# Patient Record
Sex: Female | Born: 1995 | Hispanic: Yes | Marital: Married | Smoking: Never smoker
Health system: Southern US, Community
[De-identification: ages and names within clinical notes are randomized; demographics above are authoritative.]

## PROBLEM LIST (undated history)

## (undated) DIAGNOSIS — E282 Polycystic ovarian syndrome: Secondary | ICD-10-CM

## (undated) DIAGNOSIS — L309 Dermatitis, unspecified: Secondary | ICD-10-CM

## (undated) DIAGNOSIS — D649 Anemia, unspecified: Secondary | ICD-10-CM

## (undated) DIAGNOSIS — G43909 Migraine, unspecified, not intractable, without status migrainosus: Secondary | ICD-10-CM

## (undated) DIAGNOSIS — F419 Anxiety disorder, unspecified: Secondary | ICD-10-CM

## (undated) HISTORY — PX: OTHER SURGICAL HISTORY: SHX169

## (undated) HISTORY — DX: Migraine, unspecified, not intractable, without status migrainosus: G43.909

## (undated) HISTORY — DX: Dermatitis, unspecified: L30.9

## (undated) HISTORY — PX: TONSILLECTOMY: SHX5217

## (undated) HISTORY — DX: Polycystic ovarian syndrome: E28.2

---
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
## 2020-03-11 ENCOUNTER — Other Ambulatory Visit: Payer: Self-pay

## 2020-03-11 ENCOUNTER — Encounter: Payer: Self-pay | Admitting: Family Medicine

## 2020-03-11 ENCOUNTER — Ambulatory Visit (INDEPENDENT_AMBULATORY_CARE_PROVIDER_SITE_OTHER): Payer: No Typology Code available for payment source | Admitting: Family Medicine

## 2020-03-11 VITALS — BP 118/68 | HR 103 | Temp 97.7°F | Ht 65.0 in | Wt 202.2 lb

## 2020-03-11 DIAGNOSIS — R000 Tachycardia, unspecified: Secondary | ICD-10-CM | POA: Insufficient documentation

## 2020-03-11 DIAGNOSIS — N644 Mastodynia: Secondary | ICD-10-CM | POA: Diagnosis not present

## 2020-03-11 DIAGNOSIS — R002 Palpitations: Secondary | ICD-10-CM

## 2020-03-11 DIAGNOSIS — G43109 Migraine with aura, not intractable, without status migrainosus: Secondary | ICD-10-CM | POA: Insufficient documentation

## 2020-03-11 DIAGNOSIS — E282 Polycystic ovarian syndrome: Secondary | ICD-10-CM | POA: Insufficient documentation

## 2020-03-11 NOTE — Assessment & Plan Note (Signed)
With painful breast lumps on exam. Anticipate fibrocystic breast changes, rec limiting caffeine. Pt states she had previous ultrasound/mammogram recommending biopsy >1 yr ago - will proceed with updated diagnostic mammo/US.

## 2020-03-11 NOTE — Progress Notes (Signed)
Patient ID: Alexandria Gomez, female    DOB: 1995-12-24, 25 y.o.   MRN: [id number]  This visit was conducted in person.  BP 118/68 (BP Location: Left Arm, Patient Position: Sitting, Cuff Size: Large)   Pulse (!) 103   Temp 97.7 F (36.5 C) (Temporal)   Ht 5\' 5"  (1.651 m)   Wt 202 lb 4 oz (91.7 kg)   LMP 02/20/2020   SpO2 99%   BMI 33.66 kg/m    CC: new pt to establish care  Subjective:   HPI: Alexandria Gomez is a 25 y.o. female presenting on 03/11/2020 for No chief complaint on file.   Notes fast heart rate since booster. Mild intermittent R sided chest pressure as well.   Seen at [facility] last year diagnosed with PCOS - started on medication but doesn't remember which - stopped due to hair loss and nausea (?spironolactone). She has not taken metformin. She received 03/13/2020 showing ovarian cysts and she notes hirsutism. Abdominal cramping with periods.   Migraines - overall stable period. Managed with excedrin migraine. Latest HA occurred December - usually present visual aura (spots in vision or or changing color to one side of vision) progress to nausea and headache. + photo/phonophobia, activity limiting. Doesn't remember prior meds tried. Drinks 1 cup caffeine daily.   Lump present to R lateral breast present for 7-8 yrs, not growing or changing. Tender to palpation, not itchy. More painful with period. No nipple discharge. Has had mammogram done in [facility], recommended biopsy but she never completed due to move.   S/p miscarriage age 67 yo.  LMP 02/20/2020    From 02/22/2020 Lives with husband [place] Occ: Murlean Hark at [employer] in [place] [employer]: 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 Activity: no regular exercise Diet: some water, fruits/vegetables daily     Relevant past medical, surgical, family and social history reviewed and updated as indicated. Interim medical history since our last visit reviewed. Allergies and medications reviewed and  updated. Outpatient Medications Prior to Visit  Medication Sig Dispense Refill  . Aspirin-Acetaminophen-Caffeine (EXCEDRIN MIGRAINE PO) Take by mouth as needed.    . cetirizine (ZYRTEC) 10 MG tablet Take 10 mg by mouth daily.    . rifampin (RIFADIN) 300 MG capsule Take 300 mg by mouth at bedtime.     No facility-administered medications prior to visit.     Per HPI unless specifically indicated in ROS section below Review of Systems Objective:  BP 118/68 (BP Location: Left Arm, Patient Position: Sitting, Cuff Size: Large)   Pulse (!) 103   Temp 97.7 F (36.5 C) (Temporal)   Ht 5\' 5"  (1.651 m)   Wt 202 lb 4 oz (91.7 kg)   LMP 02/20/2020   SpO2 99%   BMI 33.66 kg/m   Wt Readings from Last 3 Encounters:  03/11/20 202 lb 4 oz (91.7 kg)  01/23/12 125 lb 10.6 oz (57 kg) (70 %, Z= 0.52)*   * Growth percentiles are based on CDC (Girls, 2-20 Years) data.      Physical Exam Vitals and nursing note reviewed.  Constitutional:      Appearance: Normal appearance. She is not ill-appearing.  Eyes:     Extraocular Movements: Extraocular movements intact.     Pupils: Pupils are equal, round, and reactive to light.  Cardiovascular:     Rate and Rhythm: Normal rate and regular rhythm.     Pulses: Normal pulses.     Heart sounds: Normal heart sounds. No  murmur heard.   Pulmonary:     Effort: Pulmonary effort is normal. No respiratory distress.     Breath sounds: Normal breath sounds. No wheezing, rhonchi or rales.  Chest:  Breasts:     Right: Mass and tenderness present. No swelling, bleeding, inverted nipple, nipple discharge, skin change, axillary adenopathy or supraclavicular adenopathy.     Left: Mass present. No swelling, bleeding, inverted nipple, nipple discharge, skin change, tenderness, axillary adenopathy or supraclavicular adenopathy.      Comments:  Tender small breast lumps on R at 11 o clock Mildly tender small breast lump on L at 12 o clock Musculoskeletal:     Right  lower leg: No edema.     Left lower leg: No edema.  Lymphadenopathy:     Head:     Right side of head: No submental, submandibular or tonsillar adenopathy.     Left side of head: No submental, submandibular or tonsillar adenopathy.     Cervical: No cervical adenopathy.     Right cervical: No superficial cervical adenopathy.    Left cervical: No superficial cervical adenopathy.     Upper Body:     Right upper body: No supraclavicular or axillary adenopathy.     Left upper body: No supraclavicular or axillary adenopathy.  Skin:    General: Skin is warm and dry.     Findings: No rash.  Neurological:     Mental Status: She is alert.  Psychiatric:        Mood and Affect: Mood normal.        Behavior: Behavior normal.       EKG - NSR rate 90s, normal axis, intervals, no hypertrophy or acute ST/T changes  Assessment & Plan:  This visit occurred during the SARS-CoV-2 public health emergency.  Safety protocols were in place, including screening questions prior to the visit, additional usage of staff PPE, and extensive cleaning of exam room while observing appropriate contact time as indicated for disinfecting solutions.   Problem List Items Addressed This Visit    PCOS (polycystic ovarian syndrome)    Diagnosed in [place]. Consider trial metformin.       Migraine with aura    Very infrequent. Manages with PRN excedrin with good effect.       Relevant Medications   Aspirin-Acetaminophen-Caffeine (EXCEDRIN MIGRAINE PO)   Heart rate fast    EKG today - normal.  Discussed importance of good hydration status and limiting caffeine intake.       Breast pain - Primary    With painful breast lumps on exam. Anticipate fibrocystic breast changes, rec limiting caffeine. Pt states she had previous ultrasound/mammogram recommending biopsy >1 yr ago - will proceed with updated diagnostic mammo/US.       Relevant Orders   MM Digital Diagnostic Bilat   US BREAST LTD UNI RIGHT INC  AXILLA   US BREAST LTD UNI LEFT INC AXILLA    Other Visit Diagnoses    Palpitations       Relevant Orders   EKG 12-Lead (Completed)       No orders of the defined types were placed in this encounter.  Orders Placed This Encounter  Procedures  . MM Digital Diagnostic Bilat    Standing Status:   Future    Standing Expiration Date:   03/11/2021    Order Specific Question:   Reason for Exam (SYMPTOM  OR DIAGNOSIS REQUIRED)    Answer:   bilateral breast lumps with pain (R 11  oclock, L 12 oclock)    Order Specific Question:   Is the patient pregnant?    Answer:   No    Order Specific Question:   Preferred imaging location?    Answer:   [facility]  . US BREAST LTD UNI RIGHT INC AXILLA    Standing Status:   Future    Standing Expiration Date:   03/11/2021    Order Specific Question:   Reason for Exam (SYMPTOM  OR DIAGNOSIS REQUIRED)    Answer:   bilateral breast lumps with pain (R 11 oclock, L 12 oclock)    Order Specific Question:   Preferred imaging location?    Answer:   [facility]  . US BREAST LTD UNI LEFT INC AXILLA    Standing Status:   Future    Standing Expiration Date:   03/11/2021    Order Specific Question:   Reason for Exam (SYMPTOM  OR DIAGNOSIS REQUIRED)    Answer:   bilateral breast lumps with pain (R 11 oclock, L 12 oclock)    Order Specific Question:   Preferred imaging location?    Answer:   [facility]  . EKG 12-Lead    Patient instructions: EKG today.  I anticipate you have fibrocystic breast changes - see below. We will refer you for mammogram and ultrasound at the [facility] in [place]. Try to do breast exams monthly at the same time each month.  Increase water intake.  Return as needed or in 6 months for physical   Follow up plan: No follow-ups on file.  Eustaquio Boyden, MD

## 2020-03-11 NOTE — Assessment & Plan Note (Signed)
EKG today - normal.  Discussed importance of good hydration status and limiting caffeine intake.

## 2020-03-11 NOTE — Assessment & Plan Note (Signed)
Very infrequent. Manages with PRN excedrin with good effect.

## 2020-03-11 NOTE — Patient Instructions (Addendum)
EKG today.  I anticipate you have fibrocystic breast changes - see below. We will refer you for mammogram and ultrasound at the [facility] in [place]. Try to do breast exams monthly at the same time each month.  Increase water intake.  Return as needed or in 6 months for physical   Fibrocystic Breast Changes  Fibrocystic breast changes are changes in breast tissue that can cause breasts to become swollen, lumpy, or painful. This can happen due to buildup of scar-like tissue (fibrous tissue) or the forming of fluid-filled lumps (cysts) in the breast. Fibrocystic breast changes can affect one or both breasts. The condition is common, and it is not cancer. What are the causes? The exact cause of fibrocystic breast changes is not known. However, this condition may be:  Related to the female hormones estrogen and progesterone.  Influenced by family traits that get passed from parent to child (inherited). What are the signs or symptoms? Symptoms of this condition include:  Tenderness, swelling, mild discomfort, or pain.  Rope-like tissue that can be felt when touching the breast.  Lumps in one or both breasts.  Changes in breast size. Breasts may get larger before a menstrual period and smaller after a menstrual period.  Discharge from the nipple. Symptoms of this condition may affect one or both breasts and are usually worse before menstrual periods start. Symptoms usually get better toward the end of menstrual periods. How is this diagnosed? This condition is diagnosed based on your medical history and a physical exam of your breasts. You may also have tests, such as:  A breast X-ray (mammogram).  Ultrasound.  MRI.  Removing a small sample of tissue from the breast for tests (breast biopsy). This may be done if your health care provider thinks that something else may be causing changes in your breasts. How is this treated? Often, treatment is not needed for this condition. In  some cases, however, treatment may be needed, including:  Taking over-the-counter pain medicines to help relieve pain or discomfort.  Limiting or avoiding caffeine. Foods and beverages that contain caffeine include chocolate, soda, coffee, and tea.  Reducing sugar and fat in your diet. Treatment may also include:  A procedure to remove fluid from a cyst that is causing pain (fine needle aspiration).  Surgery to remove a cyst that is large or tender or does not go away.  Medicines that may lower the amount of female hormones. Follow these instructions at home: Self care  Check your breasts after every menstrual period. If you do not have menstrual periods, check your breasts on the first day of every month. Feel for changes in your breasts, such as: ? More tenderness. ? A new growth. ? A change in size. ? A change in an existing lump. General instructions  Take over-the-counter and prescription medicines only as told by your health care provider.  Wear a well-fitting support or sports bra, especially when exercising.  If told by your health care provider, decrease or avoid caffeine, fat, and sugar in your diet.  Keep all follow-up visits as told by your health care provider. This is important. Contact a health care provider if:  You have fluid leaking from your nipple, especially if it is bloody.  You have new lumps or bumps in your breast.  Your breast becomes enlarged, red, and painful.  You have areas of your breast that pucker inward.  Your nipple appears flat or indented. Get help right away if:  You have  redness of your breast and the redness is spreading. Summary  Fibrocystic breast changes are changes in breast tissue that can cause breasts to become swollen, lumpy, or painful.  This condition may be related to the female hormones estrogen and progesterone.  With this condition, it is important to examine your breasts after every menstrual period. If you do  not have menstrual periods, check your breasts on the first day of every month. This information is not intended to replace advice given to you by your health care provider. Make sure you discuss any questions you have with your health care provider. Document Revised: 01/26/2019 Document Reviewed: 01/26/2019 Elsevier Patient Education  2021 [employer].

## 2020-03-11 NOTE — Assessment & Plan Note (Signed)
Diagnosed in [place]. Consider trial metformin.

## 2020-04-01 ENCOUNTER — Telehealth: Payer: Self-pay

## 2020-04-01 NOTE — Telephone Encounter (Signed)
Per appt desk notes [facility] called and l/m for patient on 03/23/20 to call back and schedule mammogram and US of the breast as ordered per Dr Reece Agar. Ashtyn called patient on 03/17/20 and left message. I called again today and left message for patient to call them back to schedule.

## 2020-07-15 ENCOUNTER — Other Ambulatory Visit: Payer: Self-pay

## 2020-07-15 ENCOUNTER — Inpatient Hospital Stay (HOSPITAL_COMMUNITY)
Admission: EM | Admit: 2020-07-15 | Discharge: 2020-07-16 | Disposition: A | Discharge status: Home / Self Care | Payer: Medicaid Other | Attending: Obstetrics & Gynecology | Admitting: Obstetrics & Gynecology

## 2020-07-15 ENCOUNTER — Inpatient Hospital Stay (HOSPITAL_COMMUNITY): Payer: Medicaid Other

## 2020-07-15 ENCOUNTER — Encounter (HOSPITAL_COMMUNITY): Payer: Self-pay

## 2020-07-15 DIAGNOSIS — Z3A01 Less than 8 weeks gestation of pregnancy: Secondary | ICD-10-CM | POA: Insufficient documentation

## 2020-07-15 DIAGNOSIS — O219 Vomiting of pregnancy, unspecified: Secondary | ICD-10-CM | POA: Diagnosis not present

## 2020-07-15 DIAGNOSIS — O26891 Other specified pregnancy related conditions, first trimester: Secondary | ICD-10-CM | POA: Insufficient documentation

## 2020-07-15 DIAGNOSIS — O209 Hemorrhage in early pregnancy, unspecified: Secondary | ICD-10-CM | POA: Diagnosis not present

## 2020-07-15 DIAGNOSIS — R1030 Lower abdominal pain, unspecified: Secondary | ICD-10-CM | POA: Diagnosis not present

## 2020-07-15 DIAGNOSIS — O2621 Pregnancy care for patient with recurrent pregnancy loss, first trimester: Secondary | ICD-10-CM | POA: Insufficient documentation

## 2020-07-15 DIAGNOSIS — O200 Threatened abortion: Secondary | ICD-10-CM

## 2020-07-15 DIAGNOSIS — O4690 Antepartum hemorrhage, unspecified, unspecified trimester: Secondary | ICD-10-CM

## 2020-07-15 LAB — WET PREP, GENITAL
Sperm: NONE SEEN
Trich, Wet Prep: NONE SEEN
WBC, Wet Prep HPF POC: NONE SEEN
Yeast Wet Prep HPF POC: NONE SEEN

## 2020-07-15 LAB — POC URINE PREG, ED: Preg Test, Ur: POSITIVE — AB

## 2020-07-15 MED ORDER — ACETAMINOPHEN 500 MG PO TABS
1000.0000 mg | ORAL_TABLET | Freq: Once | ORAL | Status: AC
  Administered 2020-07-15: 1000 mg via ORAL
  Filled 2020-07-15: qty 2

## 2020-07-15 MED ORDER — METOCLOPRAMIDE HCL 10 MG PO TABS
10.0000 mg | ORAL_TABLET | Freq: Once | ORAL | Status: AC
  Administered 2020-07-15: 10 mg via ORAL
  Filled 2020-07-15: qty 1

## 2020-07-15 NOTE — MAU Provider Note (Signed)
History     CSN: [id number]  Arrival date and time: 07/15/20 2036   Event Date/Time   First Provider Initiated Contact with Patient 07/15/20 2303      Chief Complaint  Patient presents with  . Vaginal Bleeding   Alexandria Gomez is a 25 y.o. G2P0010 at [redacted]w[redacted]d by Definite LMP of May 26, 2020 who plans to receive care at [facility].  She presents today for Vaginal Bleeding.  She states she noticed spotting yesterday and is concerned because she had a miscarriage in the past and "this is how it started." She states it was light pink in color, yesterday, and today it is brown.  She reports usage of a pantyliner, but with minimal spotting noted.  She endorses cramping and has not taken anything for it.  She reports it is located in her lower abdomen. She rates the pain a 5-6/10. She denies sexual activity in the past 72 hours.     OB History    Gravida  2   Para      Term      Preterm      AB  1   Living        SAB  1   IAB      Ectopic      Multiple      Live Births              Past Medical History:  Diagnosis Date  . Migraines   . PCOS (polycystic ovarian syndrome)     Past Surgical History:  Procedure Laterality Date  . TONSILLECTOMY      Family History  Problem Relation Age of Onset  . Arthritis Mother   . Cancer Mother        kidney   . Diabetes Mother   . Learning disabilities Mother   . Miscarriages / [place] Mother   . COPD Father   . Heart attack Father 35  . Alcohol abuse Sister   . Depression Sister   . Kidney disease Sister   . Diabetes Maternal Grandmother   . High Cholesterol Maternal Grandmother   . Miscarriages / Stillbirths Maternal Grandmother   . Diabetes Maternal Grandfather   . High blood pressure Maternal Grandfather   . Breast cancer Maternal Aunt     Social History   Tobacco Use  . Smoking status: Never Smoker  . Smokeless tobacco: Never Used  Substance Use Topics  . Alcohol use: Yes    Comment:  Occasionally  . Drug use: Never    Allergies: No Known Allergies  Medications Prior to Admission  Medication Sig Dispense Refill Last Dose  . Aspirin-Acetaminophen-Caffeine (EXCEDRIN MIGRAINE PO) Take by mouth as needed.     . cetirizine (ZYRTEC) 10 MG tablet Take 10 mg by mouth daily.       Review of Systems  Gastrointestinal: Positive for abdominal pain and nausea. Negative for constipation, diarrhea and vomiting.  Genitourinary: Positive for vaginal bleeding. Negative for difficulty urinating, dysuria and vaginal discharge.  Neurological: Negative for dizziness, light-headedness and headaches.   Physical Exam   Blood pressure 116/71, pulse 87, temperature 97.9 F (36.6 C), resp. rate 16, height 5\' 5"  (1.651 m), weight 83.9 kg, last menstrual period 05/26/2020, SpO2 100 %.  Physical Exam Vitals reviewed. Exam conducted with a chaperone present.  Constitutional:      Appearance: Normal appearance.  HENT:     Head: Normocephalic and atraumatic.  Eyes:     Conjunctiva/sclera: Conjunctivae normal.  Cardiovascular:     Rate and Rhythm: Normal rate and regular rhythm.  Pulmonary:     Effort: Pulmonary effort is normal. No respiratory distress.  Abdominal:     Palpations: Abdomen is soft.     Tenderness: There is abdominal tenderness in the right lower quadrant, suprapubic area and left lower quadrant.  Genitourinary:    Vagina: Vaginal discharge present.     Cervix: Discharge present. No friability, erythema or cervical bleeding.     Comments: Speculum Exam: -Normal External Genitalia: Non tender, no apparent discharge at introitus.  -Vaginal Vault: Pink mucosa with good rugae. Scant amt thin white discharge -wet prep collected -Cervix:Pink, no lesions, cysts, or polyps.  Appears closed. No active bleeding from os-GC/CT collected with dark brown blood noted on swab.  -Bimanual Exam:  No uterine tenderness.  Patient reports discomfort, but can not differentiate whether it is  pressure or pain.   Musculoskeletal:     Cervical back: Normal range of motion.  Skin:    General: Skin is warm and dry.  Neurological:     Mental Status: She is alert and oriented to person, place, and time.  Psychiatric:        Mood and Affect: Mood normal.        Behavior: Behavior normal.        Thought Content: Thought content normal.     MAU Course  Procedures Results for orders placed or performed during the hospital encounter of 07/15/20 (from the past 24 hour(s))  POC urine preg, ED     Status: Abnormal   Collection Time: 07/15/20  9:52 PM  Result Value Ref Range   Preg Test, Ur POSITIVE (A) NEGATIVE  ABO/Rh     Status: None   Collection Time: 07/15/20 11:06 PM  Result Value Ref Range   ABO/RH(D) A POS    No rh immune globuloin      NOT A RH IMMUNE GLOBULIN CANDIDATE, PT RH POSITIVE Performed at [facility], [address] [place], [place] [address]   CBC     Status: None   Collection Time: 07/15/20 11:06 PM  Result Value Ref Range   WBC 9.6 4.0 - 10.5 K/uL   RBC 4.13 3.87 - 5.11 MIL/uL   Hemoglobin 12.5 12.0 - 15.0 g/dL   HCT 81.4 48.1 - 85.6 %   MCV 90.1 80.0 - 100.0 fL   MCH 30.3 26.0 - 34.0 pg   MCHC 33.6 30.0 - 36.0 g/dL   RDW 31.4 97.0 - 26.3 %   Platelets 273 150 - 400 K/uL   nRBC 0.0 0.0 - 0.2 %  Urinalysis, Routine w reflex microscopic Urine, Clean Catch     Status: None   Collection Time: 07/15/20 11:14 PM  Result Value Ref Range   Color, Urine YELLOW YELLOW   APPearance CLEAR CLEAR   Specific Gravity, Urine 1.023 1.005 - 1.030   pH 5.0 5.0 - 8.0   Glucose, UA NEGATIVE NEGATIVE mg/dL   Hgb urine dipstick NEGATIVE NEGATIVE   Bilirubin Urine NEGATIVE NEGATIVE   Ketones, ur NEGATIVE NEGATIVE mg/dL   Protein, ur NEGATIVE NEGATIVE mg/dL   Nitrite NEGATIVE NEGATIVE   Leukocytes,Ua NEGATIVE NEGATIVE   RBC / HPF 0-5 0 - 5 RBC/hpf   WBC, UA 0-5 0 - 5 WBC/hpf   Bacteria, UA NONE SEEN NONE SEEN   Squamous Epithelial / LPF 0-5 0 - 5   Mucus  PRESENT   Wet prep, genital  Status: Abnormal   Collection Time: 07/15/20 11:14 PM  Result Value Ref Range   Yeast Wet Prep HPF POC NONE SEEN NONE SEEN   Trich, Wet Prep NONE SEEN NONE SEEN   Clue Cells Wet Prep HPF POC PRESENT (A) NONE SEEN   WBC, Wet Prep HPF POC NONE SEEN NONE SEEN   Sperm NONE SEEN    US OB LESS THAN 14 WEEKS WITH OB TRANSVAGINAL  Result Date: 07/15/2020 CLINICAL DATA:  Spotting and cramps. EXAM: OBSTETRIC <14 WK [place] AND TRANSVAGINAL OB US TECHNIQUE: Both transabdominal and transvaginal ultrasound examinations were performed for complete evaluation of the gestation as well as the maternal uterus, adnexal regions, and pelvic cul-de-sac. Transvaginal technique was performed to assess early pregnancy. COMPARISON:  None. FINDINGS: Intrauterine gestational sac: Single Yolk sac:  Visualized. Embryo:  Visualized. Cardiac Activity: Not Visualized. Heart Rate: N/A  bpm CRL:  2.7 mm   5 w   5 d                  [place] EDC: March 12, 2021 Subchorionic hemorrhage:  None visualized. Maternal uterus/adnexae: The right ovary is not visualized. A corpus luteum cyst is seen within an otherwise normal appearing left ovary. No pelvic free fluid is seen. IMPRESSION: Single intrauterine pregnancy, at approximately 5 weeks and 5 days gestation by ultrasound evaluation, without evidence of fetal heart motion. Findings are suspicious but not yet definitive for failed pregnancy. Recommend follow-up US in 10-14 days for definitive diagnosis. This recommendation follows SRU consensus guidelines: Diagnostic Criteria for Nonviable Pregnancy Early in the First Trimester. Malva Limes Med 2013; 259:5638-75. Electronically Signed   By: Aram Candela M.D.   On: 07/15/2020 23:39    MDM Pelvic Exam; Wet Prep and GC/CT Labs: UA, UPT, CBC, hCG, ABO Ultrasound Analgesic Antiemetic Assessment and Plan  25 year old G2P0010 at 7.1 weeks Vaginal Bleeding Abdominal Cramping Nausea  -POC Reviewed. -Discussed  labs and pelvic exam including testing. -Exam performed and findings reviewed.  -Will send for [place] -Will give reglan for nausea and tylenol for pain. -Will await results.   Cherre Robins 07/15/2020, 11:03 PM   Reassessment (12:22 AM)  -[place] and lab results return as above. HCG pending. -Provider to bedside to discus. -Patient reports pain has improved.  -Informed of findings and change in EDD. -Reassured that dating could be wrong and that she is actually only 5 weeks and not 7.  Discussed need for follow up. -Cautioned that miscarriage is still of concern.  -Reviewed need for follow up US in 10-14 days.  -Patient agreeable and states that she will follow up with her primary ob on June 1st as scheduled.  -Informed of clue cells discovered on wet prep and recommendaiton for treatment based on c/o cramping and spotting. -Rx. for Metrogel 0.75% PV QHS x 5days sent to pharmacy on file.  -Patient without further questions or concerns. -Encouraged to call or return to MAU if symptoms worsen or with the onset of new symptoms. -Discharged to home in stble condition.  Cherre Robins MSN, CNM Advanced Practice Provider, [facility]

## 2020-07-15 NOTE — ED Provider Notes (Signed)
Emergency Medicine Provider Triage Evaluation Note  Alexandria Gomez , a 25 y.o. female  was evaluated in triage.  Pt complains of vaginal bleeding in early pregnancy.  LMP 05/26/2020, started spotting last night with lower abdominal cramping.  G2, P0, is not scheduled to see her OB for the first time until June..  Review of Systems  Positive: Vaginal bleeding, cramping Negative: Fever  Physical Exam  There were no vitals taken for this visit. Gen:   Awake, no distress   Resp:  Normal effort  MSK:   Moves extremities without difficulty  Other:  Abdomen soft and nontender  Medical Decision Making  Medically screening exam initiated at 8:48 PM.  Appropriate orders placed.  Alexandria Gomez was informed that the remainder of the evaluation will be completed by another provider, this initial triage assessment does not replace that evaluation, and the importance of remaining in the ED until their evaluation is complete.  Call to [facility], MAU APP who accepts pt in transfer to MAU.    Jeannie Fend, PA-C 07/15/20 2200    Benjiman Core, MD 07/15/20 2308

## 2020-07-15 NOTE — MAU Note (Signed)
I had pink spotting yesterday and today brown d/c. I have had a miscarriage and wanted to be sure everything is ok. Some period-like cramping.

## 2020-07-15 NOTE — ED Triage Notes (Signed)
Patient reports vaginal bleeding and cramping starting yesterday.   LMP 05/26/2020

## 2020-07-16 DIAGNOSIS — O209 Hemorrhage in early pregnancy, unspecified: Secondary | ICD-10-CM

## 2020-07-16 DIAGNOSIS — Z3A01 Less than 8 weeks gestation of pregnancy: Secondary | ICD-10-CM | POA: Diagnosis not present

## 2020-07-16 DIAGNOSIS — O219 Vomiting of pregnancy, unspecified: Secondary | ICD-10-CM | POA: Diagnosis not present

## 2020-07-16 LAB — URINALYSIS, ROUTINE W REFLEX MICROSCOPIC
Bacteria, UA: NONE SEEN
Bilirubin Urine: NEGATIVE
Glucose, UA: NEGATIVE mg/dL
Hgb urine dipstick: NEGATIVE
Ketones, ur: NEGATIVE mg/dL
Leukocytes,Ua: NEGATIVE
Nitrite: NEGATIVE
Protein, ur: NEGATIVE mg/dL
Specific Gravity, Urine: 1.023 (ref 1.005–1.030)
pH: 5 (ref 5.0–8.0)

## 2020-07-16 LAB — CBC
HCT: 37.2 % (ref 36.0–46.0)
Hemoglobin: 12.5 g/dL (ref 12.0–15.0)
MCH: 30.3 pg (ref 26.0–34.0)
MCHC: 33.6 g/dL (ref 30.0–36.0)
MCV: 90.1 fL (ref 80.0–100.0)
Platelets: 273 10*3/uL (ref 150–400)
RBC: 4.13 MIL/uL (ref 3.87–5.11)
RDW: 12.2 % (ref 11.5–15.5)
WBC: 9.6 10*3/uL (ref 4.0–10.5)
nRBC: 0 % (ref 0.0–0.2)

## 2020-07-16 LAB — ABO/RH: ABO/RH(D): A POS

## 2020-07-16 LAB — HCG, QUANTITATIVE, PREGNANCY: hCG, Beta Chain, Quant, S: 24810 m[IU]/mL — ABNORMAL HIGH (ref <5)

## 2020-07-16 MED ORDER — METRONIDAZOLE 0.75 % VA GEL: 1.0000 | Freq: Every day | VAGINAL | 0 refills | Status: DC

## 2020-07-16 NOTE — Discharge Instructions (Signed)
Bacterial Vaginosis  Bacterial vaginosis is an infection that occurs when the normal balance of bacteria in the vagina changes. This change is caused by an overgrowth of certain bacteria in the vagina. Bacterial vaginosis is the most common vaginal infection among females aged 23 to 44 years. This condition increases the risk of sexually transmitted infections (STIs). Treatment can help reduce this risk. Treatment is very important for pregnant women because this condition can cause babies to be born early (prematurely) or at a low birth weight. What are the causes? This condition is caused by an increase in harmful bacteria that are normally present in small amounts in the vagina. However, the exact reason this condition develops is not known. You cannot get bacterial vaginosis from toilet seats, bedding, swimming pools, or contact with objects around you. What increases the risk? The following factors may make you more likely to develop this condition:  Having a new sexual partner or multiple sexual partners, or having unprotected sex.  Douching.  Having an intrauterine device (IUD).  Smoking.  Abusing drugs and alcohol. This may lead to riskier sexual behavior.  Taking certain antibiotic medicines.  Being pregnant. What are the signs or symptoms? Some women with this condition have no symptoms. Symptoms may include:  Gray or white vaginal discharge. The discharge can be watery or foamy.  A fish-like odor with discharge, especially after sex or during menstruation.  Itching in and around the vagina.  Burning or pain with urination. How is this diagnosed? This condition is diagnosed based on:  Your medical history.  A physical exam of the vagina.  Checking a sample of vaginal fluid for harmful bacteria or abnormal cells. How is this treated? This condition is treated with antibiotic medicines. These may be given as a pill, a vaginal cream, or a medicine that is put into the  vagina (suppository). If the condition comes back after treatment, a second round of antibiotics may be needed. Follow these instructions at home: Medicines  Take or apply over-the-counter and prescription medicines only as told by your health care provider.  Take or apply your antibiotic medicine as told by your health care provider. Do not stop using the antibiotic even if you start to feel better. General instructions  If you have a female sexual partner, tell her that you have a vaginal infection. She should follow up with her health care provider. If you have a female sexual partner, he does not need treatment.  Avoid sexual activity until you finish treatment.  Drink enough fluid to keep your urine pale yellow.  Keep the area around your vagina and rectum clean. ? Wash the area daily with warm water. ? Wipe yourself from front to back after using the toilet.  If you are breastfeeding, talk to your health care provider about continuing breastfeeding during treatment.  Keep all follow-up visits. This is important. How is this prevented? Self-care  Do not douche.  Wash the outside of your vagina with warm water only.  Wear cotton or cotton-lined underwear.  Avoid wearing tight pants and pantyhose, especially during the summer. Safe sex  Use protection when having sex. This includes: ? Using condoms. ? Using dental dams. This is a thin layer of a material made of latex or polyurethane that protects the mouth during oral sex.  Limit the number of sexual partners. To help prevent bacterial vaginosis, it is best to have sex with just one partner (monogamous relationship).  Make sure you and your sexual partner   are tested for STIs. Drugs and alcohol  Do not use any products that contain nicotine or tobacco. These products include cigarettes, chewing tobacco, and vaping devices, such as e-cigarettes. If you need help quitting, ask your health care provider.  Do not use  drugs.  Do not drink alcohol if: ? Your health care provider tells you not to do this. ? You are pregnant, may be pregnant, or are planning to become pregnant.  If you drink alcohol: ? Limit how much you have to 0-1 drink a day. ? Be aware of how much alcohol is in your drink. In the [place] one drink equals one 12 oz bottle of beer (355 mL), one 5 oz glass of wine (148 mL), or one 1 oz glass of hard liquor (44 mL). Where to find more information  Centers for Disease Control and Prevention: FootballExhibition.com.br  American Sexual Health Association (ASHA): www.ashastd.org  U.S. Department of Health and Health and safety inspector, Office on Women's Health: http://hoffman.com/ Contact a health care provider if:  Your symptoms do not improve, even after treatment.  You have more discharge or pain when urinating.  You have a fever or chills.  You have pain in your abdomen or pelvis.  You have pain during sex.  You have vaginal bleeding between menstrual periods. Summary  Bacterial vaginosis is a vaginal infection that occurs when the normal balance of bacteria in the vagina changes. It results from an overgrowth of certain bacteria.  This condition increases the risk of sexually transmitted infections (STIs). Getting treated can help reduce this risk.  Treatment is very important for pregnant women because this condition can cause babies to be born early (prematurely) or at low birth weight.  This condition is treated with antibiotic medicines. These may be given as a pill, a vaginal cream, or a medicine that is put into the vagina (suppository). This information is not intended to replace advice given to you by your health care provider. Make sure you discuss any questions you have with your health care provider. Document Revised: 08/13/2019 Document Reviewed: 08/13/2019 Elsevier Patient Education  2021 Elsevier Inc. Threatened Miscarriage A threatened miscarriage occurs when a woman has vaginal  bleeding during the first 20 weeks of pregnancy but the pregnancy has not ended. If vaginal bleeding occurs during this time, the health care provider will do tests to make sure the woman is still pregnant. The woman's condition may be considered a threatened miscarriage if the tests show:  That she is still pregnant.  That the embryo or unborn baby (fetus) inside the uterus is still growing. A threatened miscarriage does not mean your pregnancy will end, but it does increase the risk of losing your pregnancy (miscarriage). What are the causes? The cause of this condition is usually not known. What increases the risk? The following factors may make a pregnant woman more likely to have a miscarriage: Certain medical conditions  Conditions that affect the hormone balance in the body, such as thyroid disease or polycystic ovary syndrome.  Diabetes.  Autoimmune disorders.  Infections.  Bleeding disorders.  Obesity. Lifestyle factors  Using products with tobacco or nicotine or being exposed to tobacco smoke.  Having alcohol.  Having large amounts of caffeine.  Recreational drug use. Problems with reproductive organs or structures  Cervical insufficiency. This is when the the lowest part of the uterus (cervix) opens and thins before pregnancy is at term.  Having a condition called Asherman syndrome, which causes scarring in the uterus or causes the  uterus to be abnormal in structure.  Fibrous growths, called fibroids, in the uterus.  Congenital abnormalities. These problems are present at birth.  Infection of the cervix or uterus. Personal or medical history  Injury (trauma).  Having had a miscarriage before.  Being younger than age 52 or older than age 64.  Exposure to harmful substances in the environment. This may include radiation or heavy metals, such as lead.  Using certain medicines. What are the signs or symptoms? Symptoms of this condition include:  Vaginal  bleeding or spotting, with or without cramps or pain.  Mild pain or cramps in your abdomen. How is this diagnosed? You may have tests to check whether you are still pregnant. These tests will be done if you have bleeding, with or without pain, in your abdomen before the 20th week of pregnancy. These tests include:  Ultrasound.  A physical exam.  Measurement of your baby's heart rate.  Lab tests, such as blood tests, urine tests, or swabs for infection. You may be diagnosed with a threatened miscarriage if:  Ultrasound testing shows that you are still pregnant.  Your baby's heart rate is strong.  A physical exam shows that your cervix is closed.  Blood tests confirm that you are still pregnant.   How is this treated? No treatments have been shown to prevent a threatened miscarriage from going on to a complete miscarriage. However, the right home care is important. Follow these instructions at home:  Get plenty of rest.  Do not have sex, douche, or put anything in your vagina, such as tampons, until your health care provider says it is okay.  Do not smoke or use recreational drugs.  Do not drink alcohol.  Avoid caffeine.  Keep all follow-up prenatal visits. This is important. Contact a health care provider if:  You have light vaginal bleeding or spotting while pregnant.  You have pain or cramping in your abdomen.  You have a fever. Get help right away if:  Heavy bleeding soaks through 2 large sanitary pads an hour for more than 2 hours.  Blood clots come out of your vagina.  Tissue comes out of your vagina.  You leak fluid, or you have a gush of fluid from your vagina.  You have severe low back pain or cramps in your abdomen.  You have a fever, chills, and severe pain in the abdomen. Summary  A threatened miscarriage occurs when a woman bleeds from the vagina during the first 20 weeks of pregnancy but the pregnancy has not ended.  The cause of a threatened  miscarriage is usually not known.  Symptoms of this condition may include vaginal bleeding and mild pain or cramps in your abdomen.  No treatments have been shown to prevent a threatened miscarriage from going on to a complete miscarriage.  Keep all follow-up prenatal visits. This is important. This information is not intended to replace advice given to you by your health care provider. Make sure you discuss any questions you have with your health care provider. Document Revised: 08/14/2019 Document Reviewed: 08/14/2019 Elsevier Patient Education  2021 [employer].

## 2020-07-16 NOTE — Progress Notes (Signed)
Jessica Emly CNM in earlier to discuss test results and d/c plan. Written and verbal d/c instructions given and understanding voiced. 

## 2020-07-18 LAB — GC/CHLAMYDIA PROBE AMP (~~LOC~~) NOT AT ARMC
Chlamydia: NEGATIVE
Comment: NEGATIVE
Comment: NORMAL
Neisseria Gonorrhea: NEGATIVE

## 2020-07-19 ENCOUNTER — Telehealth: Payer: Self-pay | Admitting: Certified Nurse Midwife

## 2020-07-19 NOTE — Telephone Encounter (Signed)
5-24- patient called stating that she went to er in [place] for spotting/cramping - [facility] - they suggested pt get [place] within 10 days - made pt aware no [place] tech in office. She is a New Gyn patient scheduled for June1st. Patient is asking for a rx. for nausea- I made her aware that the provider can not order her anything since she has not been seen in our office before. Please Advise.

## 2020-07-20 ENCOUNTER — Other Ambulatory Visit: Payer: Self-pay | Admitting: Certified Nurse Midwife

## 2020-07-20 DIAGNOSIS — O3680X0 Pregnancy with inconclusive fetal viability, not applicable or unspecified: Secondary | ICD-10-CM

## 2020-07-20 NOTE — Telephone Encounter (Signed)
Called patient back to see if she is pregnant. No answer, lvm to rtc.

## 2020-07-27 ENCOUNTER — Ambulatory Visit (INDEPENDENT_AMBULATORY_CARE_PROVIDER_SITE_OTHER): Payer: Medicaid Other | Admitting: Certified Nurse Midwife

## 2020-07-27 ENCOUNTER — Other Ambulatory Visit: Payer: Self-pay

## 2020-07-27 ENCOUNTER — Encounter: Payer: Self-pay | Admitting: Certified Nurse Midwife

## 2020-07-27 VITALS — BP 102/69 | HR 86 | Resp 16 | Ht 65.0 in | Wt 186.9 lb

## 2020-07-27 DIAGNOSIS — Z3200 Encounter for pregnancy test, result unknown: Secondary | ICD-10-CM

## 2020-07-27 MED ORDER — DOXYLAMINE-PYRIDOXINE 10-10 MG PO TBEC: 1.0000 | DELAYED_RELEASE_TABLET | Freq: Four times a day (QID) | ORAL | 5 refills | Status: DC

## 2020-07-27 NOTE — Progress Notes (Signed)
Subjective:    Alexandria Gomez is a 25 y.o. female who presents for evaluation of amenorrhea. She believes she could be pregnant. Pregnancy is desired. Sexual Activity: single partner, contraception: none. Current symptoms also include: breast tenderness, nausea and vomiting. Last period was normal. Pt was seen in ED 5/20 with vaginal bleeding and cramping. U/s completed results show: Single intrauterine pregnancy, at approximately 5 weeks and 5 days gestation by ultrasound evaluation, without evidence of fetal heart motion. Findings are suspicious but not yet definitive for failed pregnancy. Recommend follow-up US in 10-14 days for definitive diagnosis.    Repeat u/s for viability scheduled 08/02/2020.  Patient's last menstrual period was 05/26/2020 (exact date). The following portions of the patient's history were reviewed and updated as appropriate: allergies, current medications, past family history, past medical history, past social history, past surgical history and problem list.  Review of Systems Pertinent items are noted in HPI.     Objective:    BP 102/69   Pulse 86   Resp 16   Ht 5\' 5"  (1.651 m)   Wt 186 lb 14.4 oz (84.8 kg)   LMP 05/26/2020 (Exact Date)   BMI 31.10 kg/m  General: alert, cooperative, appears stated age, mild distress and no acute distress    Lab Review Urine HCG: positive    Assessment:    Absence of menstruation.     Plan:     Positive: EDC: 03/12/2021. Briefly discussed pre-natal care options. Md and midwifery services reviewed. Pt plans to see midwives. Encouraged well-balanced diet, plenty of rest when needed, pre-natal vitamins daily and walking for exercise. Discussed self-help for nausea, avoiding OTC medications until consulting provider or pharmacist, other than Tylenol as needed, minimal caffeine (1-2 cups daily) and avoiding alcohol. She will schedule her initial nurse visit in 3 wks, and NOB visit in the 5 wks. Orders placed for diclegis  . 03/14/2021 Feel free to call with any questions.   Marland Kitchen, CNM

## 2020-08-02 ENCOUNTER — Telehealth: Payer: Self-pay | Admitting: Certified Nurse Midwife

## 2020-08-02 ENCOUNTER — Ambulatory Visit
Admission: RE | Admit: 2020-08-02 | Discharge: 2020-08-02 | Disposition: A | Discharge status: Home / Self Care | Payer: Medicaid Other | Source: Ambulatory Visit | Attending: Certified Nurse Midwife | Admitting: Certified Nurse Midwife

## 2020-08-02 ENCOUNTER — Other Ambulatory Visit: Payer: Self-pay

## 2020-08-02 DIAGNOSIS — O3680X0 Pregnancy with inconclusive fetal viability, not applicable or unspecified: Secondary | ICD-10-CM | POA: Insufficient documentation

## 2020-08-02 NOTE — Telephone Encounter (Signed)
Patient states she wasn't prescribed any prenatal vitamins and is wondering if that was suppose to be done or if that will be done at her next visit

## 2020-08-04 ENCOUNTER — Other Ambulatory Visit: Payer: Self-pay | Admitting: Certified Nurse Midwife

## 2020-08-04 DIAGNOSIS — O3680X0 Pregnancy with inconclusive fetal viability, not applicable or unspecified: Secondary | ICD-10-CM

## 2020-08-04 MED ORDER — COMPLETENATE 29-1 MG PO CHEW: 1.0000 | CHEWABLE_TABLET | Freq: Every day | ORAL | 11 refills | Status: DC

## 2020-08-04 NOTE — Telephone Encounter (Signed)
Pt called no answer LM via VM that [facility] did not prescribe her any prenatal vitamins. Informed pt via vm that I sent in a rx for prenatal vitamins.

## 2020-08-05 MED ORDER — PRENATAL PLUS VITAMIN/MINERAL 27-1 MG PO TABS: 30.0000 mg | ORAL_TABLET | Freq: Every day | ORAL | 11 refills | Status: DC

## 2020-08-05 NOTE — Telephone Encounter (Signed)
Pt called no answer LM via VM that prenatal vitamins was sent to her pharmacy 08/04/2020 and to please check with the pharmacy. Pt was advised that if her insurance did not cover the medication to contact the office and we will send in something else.

## 2020-08-17 ENCOUNTER — Ambulatory Visit (INDEPENDENT_AMBULATORY_CARE_PROVIDER_SITE_OTHER): Payer: Medicaid Other | Admitting: Certified Nurse Midwife

## 2020-08-17 ENCOUNTER — Other Ambulatory Visit: Payer: Self-pay

## 2020-08-17 VITALS — BP 114/77 | HR 103 | Resp 16 | Ht 65.0 in | Wt 190.9 lb

## 2020-08-17 DIAGNOSIS — E669 Obesity, unspecified: Secondary | ICD-10-CM

## 2020-08-17 DIAGNOSIS — Z3401 Encounter for supervision of normal first pregnancy, first trimester: Secondary | ICD-10-CM | POA: Diagnosis not present

## 2020-08-17 DIAGNOSIS — Z3A10 10 weeks gestation of pregnancy: Secondary | ICD-10-CM | POA: Diagnosis not present

## 2020-08-17 DIAGNOSIS — Z113 Encounter for screening for infections with a predominantly sexual mode of transmission: Secondary | ICD-10-CM

## 2020-08-17 LAB — OB RESULTS CONSOLE VARICELLA ZOSTER ANTIBODY, IGG: Varicella: IMMUNE

## 2020-08-17 NOTE — Patient Instructions (Signed)
Morning Sickness  Morning sickness is when you feel like you may vomit (feel nauseous) during pregnancy. Sometimes, you may vomit. Morning sickness most often happens in the morning, but it can also happen at any time of the day. Some women may have morning sickness that makes them vomit all the time. This is amore serious problem that needs treatment. What are the causes? The cause of this condition is not known. What increases the risk? You had vomiting or a feeling like you may vomit before your pregnancy. You had morning sickness in another pregnancy. You are pregnant with more than one baby, such as twins. What are the signs or symptoms? Feeling like you may vomit. Vomiting. How is this treated? Treatment is usually not needed for this condition. You may only need to change what you eat. In some cases, your doctor may give you some things to take for your condition. These include: Vitamin B6 supplements. Medicines to treat the feeling that you may vomit. Ginger. Follow these instructions at home: Medicines Take over-the-counter and prescription medicines only as told by your doctor. Do not take any medicines until you talk with your doctor about them first. Take multivitamins before you get pregnant. These can stop or lessen the symptoms of morning sickness. Eating and drinking Eat dry toast or crackers before getting out of bed. Eat 5 or 6 small meals a day. Eat dry and bland foods like rice and baked potatoes. Do not eat greasy, fatty, or spicy foods. Have someone cook for you if the smell of food causes you to vomit or to feel like you may vomit. If you feel like you may vomit after taking prenatal vitamins, take them at night or with a snack. Eat protein foods when you need a snack. Nuts, yogurt, and cheese are good choices. Drink fluids throughout the day. Try ginger ale made with real ginger, ginger tea made from fresh grated ginger, or ginger candies. General  instructions Do not smoke or use any products that contain nicotine or tobacco. If you need help quitting, ask your doctor. Use an air purifier to keep the air in your house free of smells. Get lots of fresh air. Try to avoid smells that make you feel sick. Try wearing an acupressure wristband. This is a wristband that is used to treat seasickness. Try a treatment called acupuncture. In this treatment, a doctor puts needles into certain areas of your body to make you feel better. Contact a doctor if: You need medicine to feel better. You feel dizzy or light-headed. You are losing weight. Get help right away if: The feeling that you may vomit will not go away, or you cannot stop vomiting. You faint. You have very bad pain in your belly. Summary Morning sickness is when you feel like you may vomit (feel nauseous) during pregnancy. You may feel sick in the morning, but you can feel this way at any time of the day. Making some changes to what you eat may help your symptoms go away. This information is not intended to replace advice given to you by your health care provider. Make sure you discuss any questions you have with your healthcare provider. Document Revised: 09/28/2019 Document Reviewed: 09/07/2019 Elsevier Patient Education  [address] [place] Class  August 08, 2016  Wednesday 7:00p - 9:00p  [facility] [place], [place]  September 12, 2016  Wednesday 7:00p - 9:00p [facility] [place], [place]    October 17, 2016  Wednesday 7:00p - 9:000p [facility] [place], [place]  November 14, 2016  Wednesday  7:00p - 9:00p [facility] [place], [place]  December 12, 2016 Wednesday 7:00p - 9:00p [facility] [place], [place]  Interested in a waterbirth?  This informational class will help you discover whether waterbirth is the right fit for you.  Education about waterbirth itself,  supplies you would need and how to assemble your support team is what you can expect from this class.  Some obstetrical practices require this class in order to pursue a waterbirth.  (Not all obstetrical practices offer waterbirth check with your healthcare provider)  Register only the expectant mom, but you are encouraged to bring your partner to class!  Fees & Payment No fee  Register Online [email or web address].se Search Waterbirth [email or web address].[email or web address]">  First Trimester of Pregnancy  The first trimester of pregnancy starts on the first day of your last menstrual period until the end of week 12. This is also called months 1 through 3 ofpregnancy. Body changes during your first trimester Your body goes through many changes during pregnancy. The changes usuallyreturn to normal after your baby is born. Physical changes You may gain or lose weight. Your breasts may grow larger and hurt. The area around your nipples may get darker. Dark spots or blotches may develop on your face. You may have changes in your hair. Health changes You may feel like you might vomit (nauseous), and you may vomit. You may have heartburn. You may have headaches. You may have trouble pooping (constipation). Your gums may bleed. Other changes You may get tired easily. You may pee (urinate) more often. Your menstrual periods will stop. You may not feel hungry. You may want to eat certain kinds of food. You may have changes in your emotions from day to day. You may have more dreams. Follow these instructions at home: Medicines Take over-the-counter and prescription medicines only as told by your doctor. Some medicines are not safe during pregnancy. Take a prenatal vitamin that contains at least 600 micrograms (mcg) of folic acid. Eating and drinking Eat healthy meals that include: Fresh fruits and vegetables. Whole grains. Good sources of protein, such as meat, eggs,  or tofu. Low-fat dairy products. Avoid raw meat and unpasteurized juice, milk, and cheese. If you feel like you may vomit, or you vomit: Eat 4 or 5 small meals a day instead of 3 large meals. Try eating a few soda crackers. Drink liquids between meals instead of during meals. You may need to take these actions to prevent or treat trouble pooping: Drink enough fluids to keep your pee (urine) pale yellow. Eat foods that are high in fiber. These include beans, whole grains, and fresh fruits and vegetables. Limit foods that are high in fat and sugar. These include fried or sweet foods. Activity Exercise only as told by your doctor. Most people can do their usual exercise routine during pregnancy. Stop exercising if you have cramps or pain in your lower belly (abdomen) or low back. Do not exercise if it is too hot or too humid, or if you are in a place of great height (high altitude). Avoid heavy lifting. If you choose to, you may have sex unless your doctor tells you not to. Relieving pain and discomfort Wear a good support bra if your breasts are sore. Rest with your legs raised (elevated) if you have leg cramps or low back pain. If you have bulging veins (varicose veins) in your legs:  Wear support hose as told by your doctor. Raise your feet for 15 minutes, 3-4 times a day. Limit salt in your food. Safety Wear your seat belt at all times when you are in a car. Talk with your doctor if someone is hurting you or yelling at you. Talk with your doctor if you are feeling sad or have thoughts of hurting yourself. Lifestyle Do not use hot tubs, steam rooms, or saunas. Do not douche. Do not use tampons or scented sanitary pads. Do not use herbal medicines, illegal drugs, or medicines that are not approved by your doctor. Do not drink alcohol. Do not smoke or use any products that contain nicotine or tobacco. If you need help quitting, ask your doctor. Avoid cat litter boxes and soil that is  used by cats. These carry germs that can cause harm to the baby and can cause a loss of your baby by miscarriage or stillbirth. General instructions Keep all follow-up visits. This is important. Ask for help if you need counseling or if you need help with nutrition. Your doctor can give you advice or tell you where to go for help. Visit your dentist. At home, brush your teeth with a soft toothbrush. Floss gently. Write down your questions. Take them to your prenatal visits. Where to find more information American Pregnancy Association: americanpregnancy.org [employer]necologists: www.acog.org Office on Women's Health: MightyReward.co.nz Contact a doctor if: You are dizzy. You have a fever. You have mild cramps or pressure in your lower belly. You have a nagging pain in your belly area. You continue to feel like you may vomit, you vomit, or you have watery poop (diarrhea) for 24 hours or longer. You have a bad-smelling fluid coming from your vagina. You have pain when you pee. You are exposed to a disease that spreads from person to person, such as chickenpox, measles, Zika virus, HIV, or hepatitis. Get help right away if: You have spotting or bleeding from your vagina. You have very bad belly cramping or pain. You have shortness of breath or chest pain. You have any kind of injury, such as from a fall or a car crash. You have new or increased pain, swelling, or redness in an arm or leg. Summary The first trimester of pregnancy starts on the first day of your last menstrual period until the end of week 12 (months 1 through 3). Eat 4 or 5 small meals a day instead of 3 large meals. Do not smoke or use any products that contain nicotine or tobacco. If you need help quitting, ask your doctor. Keep all follow-up visits. This information is not intended to replace advice given to you by your health care provider. Make sure you discuss any questions you  have with your healthcare provider. Document Revised: 07/22/2019 Document Reviewed: 05/28/2019 Elsevier Patient Education  2022 Elsevier Inc. [facility]  [address] [place], [place], [place] [address]  Phone: [phone]  [facility] (second location)  [address] [place], [place] [address]  Phone: [phone]  [facility]) [address] [place], [place], [place] [address] Phone: [phone]  [facility]  [address] [place], [place] [address]  Phone: [phone] Asked Questions During Pregnancy  Cats: A parasite can be excreted in cat feces.  To avoid exposure you need to have another person empty the little box.  If you must empty the litter box you will need to wear gloves.  Wash your hands after handling your cat.  This parasite can also be found in raw or undercooked meat so this should also be avoided.  Colds, Sore Throats, Flu: Please check your medication sheet to see what you can take for symptoms.  If your symptoms are unrelieved by these medications please call the office.  Dental Work: Most any dental work Agricultural consultantyour dentist recommends is permitted.  X-rays should only be taken during the first trimester if absolutely necessary.  Your abdomen should be shielded with a lead apron during all x-rays.  Please notify your provider prior to receiving any x-rays.  Novocaine is fine; gas is not recommended.  If your dentist requires a note from us prior to dental work please call the office and we will provide one for you.  Exercise: Exercise is an important part of staying healthy during your pregnancy.  You may continue most exercises you were accustomed to prior to pregnancy.  Later in your pregnancy you will most likely notice you have difficulty with activities requiring balance like riding a bicycle.  It is important that you listen to your body and avoid activities that put you at a higher  risk of falling.  Adequate rest and staying well hydrated are a must!  If you have questions about the safety of specific activities ask your provider.    Exposure to Children with illness: Try to avoid obvious exposure; report any symptoms to us when noted,  If you have chicken pos, red measles or mumps, you should be immune to these diseases.   Please do not take any vaccines while pregnant unless you have checked with your OB provider.  Fetal Movement: After 28 weeks we recommend you do "kick counts" twice daily.  Lie or sit down in a calm quiet environment and count your baby movements "kicks".  You should feel your baby at least 10 times per hour.  If you have not felt 10 kicks within the first hour get up, walk around and have something sweet to eat or drink then repeat for an additional hour.  If count remains less than 10 per hour notify your provider.  Fumigating: Follow your pest control agent's advice as to how long to stay out of your home.  Ventilate the area well before re-entering.  Hemorrhoids:   Most over-the-counter preparations can be used during pregnancy.  Check your medication to see what is safe to use.  It is important to use a stool softener or fiber in your diet and to drink lots of liquids.  If hemorrhoids seem to be getting worse please call the office.   Hot Tubs:  Hot tubs Jacuzzis and saunas are not recommended while pregnant.  These increase your internal body temperature and should be avoided.  Intercourse:  Sexual intercourse is safe during pregnancy as long as you are comfortable, unless otherwise advised by your provider.  Spotting may occur after intercourse; report any bright red bleeding that is heavier than spotting.  Labor:  If you know that you are in labor, please go to the hospital.  If you are unsure, please call the office and let us help you decide what to do.  Lifting, straining, etc:  If your job requires heavy lifting or straining please check with  your provider for any limitations.  Generally, you should not lift items heavier than that you can lift simply with your hands and arms (no back muscles)  Painting:  Paint fumes do not harm your pregnancy,  but may make you ill and should be avoided if possible.  Latex or water based paints have less odor than oils.  Use adequate ventilation while painting.  Permanents & Hair Color:  Chemicals in hair dyes are not recommended as they cause increase hair dryness which can increase hair loss during pregnancy.  " Highlighting" and permanents are allowed.  Dye may be absorbed differently and permanents may not hold as well during pregnancy.  Sunbathing:  Use a sunscreen, as skin burns easily during pregnancy.  Drink plenty of fluids; avoid over heating.  Tanning Beds:  Because their possible side effects are still unknown, tanning beds are not recommended.  Ultrasound Scans:  Routine ultrasounds are performed at approximately 20 weeks.  You will be able to see your baby's general anatomy an if you would like to know the gender this can usually be determined as well.  If it is questionable when you conceived you may also receive an ultrasound early in your pregnancy for dating purposes.  Otherwise ultrasound exams are not routinely performed unless there is a medical necessity.  Although you can request a scan we ask that you pay for it when conducted because insurance does not cover " patient request" scans.  Work: If your pregnancy proceeds without complications you may work until your due date, unless your physician or employer advises otherwise.  Round Ligament Pain/Pelvic Discomfort:  Sharp, shooting pains not associated with bleeding are fairly common, usually occurring in the second trimester of pregnancy.  They tend to be worse when standing up or when you remain standing for long periods of time.  These are the result of pressure of certain pelvic ligaments called "round ligaments".  Rest, Tylenol  and heat seem to be the most effective relief.  As the womb and fetus grow, they rise out of the pelvis and the discomfort improves.  Please notify the office if your pain seems different than that described.  It may represent a more serious condition.

## 2020-08-17 NOTE — Progress Notes (Signed)
Alexandria Gomez presents for NOB nurse interview visit. Pregnancy confirmation done 07/15/2020.  [id number] . Pregnancy education material explained and given. There are 0 cats in the home. NOB labs ordered. TSH/HbgA1c done, due to increased BMI. Body mass index is 31.77 kg/m. HIV labs and Drug screen were explained optional and she did not decline. Drug screen ordered. PNV encouraged and she has already started with PNV. Genetic screening options discussed. Genetic testing: Ordered. She wants [place] Pt may discuss with provider.  Financial policy not reviewed, she has medicaid. FMLA from not reviewed and signed, she said it was not necessary. It has already been taken care of. Pt. To follow up with provider in 5-6 days for NOB physical.  All questions answered.

## 2020-08-18 LAB — MONITOR DRUG PROFILE 14(MW)
Amphetamine Scrn, Ur: NEGATIVE ng/mL
BARBITURATE SCREEN URINE: NEGATIVE ng/mL
BENZODIAZEPINE SCREEN, URINE: NEGATIVE ng/mL
Buprenorphine, Urine: NEGATIVE ng/mL
CANNABINOIDS UR QL SCN: NEGATIVE ng/mL
Cocaine (Metab) Scrn, Ur: NEGATIVE ng/mL
Creatinine(Crt), U: 163.3 mg/dL (ref 20.0–300.0)
Fentanyl, Urine: NEGATIVE pg/mL
Meperidine Screen, Urine: NEGATIVE ng/mL
Methadone Screen, Urine: NEGATIVE ng/mL
OXYCODONE+OXYMORPHONE UR QL SCN: NEGATIVE ng/mL
Opiate Scrn, Ur: NEGATIVE ng/mL
Ph of Urine: 5.6 (ref 4.5–8.9)
Phencyclidine Qn, Ur: NEGATIVE ng/mL
Propoxyphene Scrn, Ur: NEGATIVE ng/mL
SPECIFIC GRAVITY: 1.021
Tramadol Screen, Urine: NEGATIVE ng/mL

## 2020-08-18 LAB — HIV ANTIBODY (ROUTINE TESTING W REFLEX): HIV Screen 4th Generation wRfx: NONREACTIVE

## 2020-08-18 LAB — HCV INTERPRETATION

## 2020-08-18 LAB — URINALYSIS, ROUTINE W REFLEX MICROSCOPIC
Bilirubin, UA: NEGATIVE
Glucose, UA: NEGATIVE
Ketones, UA: NEGATIVE
Leukocytes,UA: NEGATIVE
Nitrite, UA: NEGATIVE
Protein,UA: NEGATIVE
RBC, UA: NEGATIVE
Specific Gravity, UA: 1.026 (ref 1.005–1.030)
Urobilinogen, Ur: 0.2 mg/dL (ref 0.2–1.0)
pH, UA: 6 (ref 5.0–7.5)

## 2020-08-18 LAB — VIRAL HEPATITIS HBV, HCV
HCV Ab: <0.1 s/co ratio (ref 0.0–0.9)
Hep B Core Total Ab: NEGATIVE
Hep B Surface Ab, Qual: REACTIVE
Hepatitis B Surface Ag: NEGATIVE

## 2020-08-18 LAB — RUBELLA SCREEN: Rubella Antibodies, IGG: 7.23 index (ref >0.99)

## 2020-08-18 LAB — ABO AND RH: Rh Factor: POSITIVE

## 2020-08-18 LAB — VARICELLA ZOSTER ANTIBODY, IGG: Varicella zoster IgG: 503 index (ref >165)

## 2020-08-18 LAB — ANTIBODY SCREEN: Antibody Screen: NEGATIVE

## 2020-08-18 LAB — RPR: RPR Ser Ql: NONREACTIVE

## 2020-08-19 LAB — TSH: TSH: 1.2 u[IU]/mL (ref 0.450–4.500)

## 2020-08-19 LAB — HEMOGLOBIN A1C
Est. average glucose Bld gHb Est-mCnc: 100 mg/dL
Hgb A1c MFr Bld: 5.1 % (ref 4.8–5.6)

## 2020-08-19 LAB — GC/CHLAMYDIA PROBE AMP
Chlamydia trachomatis, NAA: NEGATIVE
Neisseria Gonorrhoeae by PCR: NEGATIVE

## 2020-08-19 LAB — SPECIMEN STATUS REPORT

## 2020-08-22 ENCOUNTER — Other Ambulatory Visit: Payer: Self-pay | Admitting: Certified Nurse Midwife

## 2020-08-22 LAB — CULTURE, OB URINE

## 2020-08-22 LAB — URINE CULTURE, OB REFLEX

## 2020-08-22 MED ORDER — AMPICILLIN 500 MG PO CAPS: 500.0000 mg | ORAL_CAPSULE | Freq: Four times a day (QID) | ORAL | 0 refills | Status: AC

## 2020-08-31 ENCOUNTER — Ambulatory Visit (INDEPENDENT_AMBULATORY_CARE_PROVIDER_SITE_OTHER): Payer: Medicaid Other | Admitting: Certified Nurse Midwife

## 2020-08-31 ENCOUNTER — Other Ambulatory Visit: Payer: Self-pay

## 2020-08-31 ENCOUNTER — Encounter: Payer: Self-pay | Admitting: Certified Nurse Midwife

## 2020-08-31 ENCOUNTER — Other Ambulatory Visit (HOSPITAL_COMMUNITY)
Admission: RE | Admit: 2020-08-31 | Discharge: 2020-08-31 | Disposition: A | Discharge status: Home / Self Care | Payer: Medicaid Other | Source: Ambulatory Visit | Attending: Certified Nurse Midwife | Admitting: Certified Nurse Midwife

## 2020-08-31 VITALS — BP 129/85 | HR 99 | Wt 188.5 lb

## 2020-08-31 DIAGNOSIS — Z3A12 12 weeks gestation of pregnancy: Secondary | ICD-10-CM | POA: Insufficient documentation

## 2020-08-31 DIAGNOSIS — Z3401 Encounter for supervision of normal first pregnancy, first trimester: Secondary | ICD-10-CM | POA: Diagnosis present

## 2020-08-31 LAB — POCT URINALYSIS DIPSTICK OB
Bilirubin, UA: NEGATIVE
Blood, UA: NEGATIVE
Glucose, UA: NEGATIVE
Ketones, UA: NEGATIVE
Leukocytes, UA: NEGATIVE
Nitrite, UA: NEGATIVE
POC,PROTEIN,UA: NEGATIVE
Spec Grav, UA: 1.01 (ref 1.010–1.025)
Urobilinogen, UA: 0.2 E.U./dL
pH, UA: 7.5 (ref 5.0–8.0)

## 2020-08-31 NOTE — Patient Instructions (Signed)
https://www.acog.org/womens-health/faqs/prenatal-genetic-screening-tests">  Prenatal Care Prenatal care is health care during pregnancy. It helps you and your unborn baby (fetus) stay as healthy as possible. Prenatal care may be provided by a midwife, a family practice doctor, a mid-level practitioner (nurse practitioner or physician assistant), or a childbirth and pregnancy doctor (obstetrician). How does this affect me? During pregnancy, you will be closely monitored for any new conditions that might develop. To lower your risk of pregnancy complications, you and yourhealth care provider will talk about any underlying conditions you have. How does this affect my baby? Early and consistent prenatal care increases the chance that your baby will be healthy during pregnancy. Prenatal care lowers the risk that your baby will be: Born early (prematurely). Smaller than expected at birth (small for gestational age). What can I expect at the first prenatal care visit? Your first prenatal care visit will likely be the longest. You should schedule your first prenatal care visit as soon as you know that you are pregnant. Your first visit is a good time to talk about any questions or concerns you haveabout pregnancy. Medical history At your visit, you and your health care provider will talk about your medical history, including: Any past pregnancies. Your family's medical history. Medical history of the baby's father. Any long-term (chronic) health conditions you have and how you manage them. Any surgeries or procedures you have had. Any current over-the-counter or prescription medicines, herbs, or supplements that you are taking. Other factors that could pose a risk to your baby, including: Exposure to harmful chemicals or radiation at work or at home. Any substance use, including tobacco, alcohol, and drug use. Your home setting and your stress levels, including: Exposure to abuse or  violence. Household financial strain. Your daily health habits, including diet and exercise. Tests and screenings Your health care provider will: Measure your weight, height, and blood pressure. Do a physical exam, including a pelvic and breast exam. Perform blood tests and urine tests to check for: Urinary tract infection. Sexually transmitted infections (STIs). Low iron levels in your blood (anemia). Blood type and certain proteins on red blood cells (Rh antibodies). Infections and immunity to viruses, such as hepatitis B and rubella. HIV (human immunodeficiency virus). Discuss your options for genetic screening. Tips about staying healthy Your health care provider will also give you information about how to keep yourself and your baby healthy, including: Nutrition and taking vitamins. Physical activity. How to manage pregnancy symptoms such as nausea and vomiting (morning sickness). Infections and substances that may be harmful to your baby and how to avoid them. Food safety. Dental care. Working. Travel. Warning signs to watch for and when to call your health care provider. How often will I have prenatal care visits? After your first prenatal care visit, you will have regular visits throughout your pregnancy. The visit schedule is often as follows: Up to week 28 of pregnancy: once every 4 weeks. 28-36 weeks: once every 2 weeks. After 36 weeks: every week until delivery. Some women may have visits more or less often depending on any underlyinghealth conditions and the health of the baby. Keep all follow-up and prenatal care visits. This is important. What happens during routine prenatal care visits? Your health care provider will: Measure your weight and blood pressure. Check for fetal heart sounds. Measure the height of your uterus in your abdomen (fundal height). This may be measured starting around week 20 of pregnancy. Check the position of your baby inside your  uterus. Ask questions   about your diet, sleeping patterns, and whether you can feel the baby move. Review warning signs to watch for and signs of labor. Ask about any pregnancy symptoms you are having and how you are dealing with them. Symptoms may include: Headaches. Nausea and vomiting. Vaginal discharge. Swelling. Fatigue. Constipation. Changes in your vision. Feeling persistently sad or anxious. Any discomfort, including back or pelvic pain. Bleeding or spotting. Make a list of questions to ask your health care provider at your routinevisits. What tests might I have during prenatal care visits? You may have blood, urine, and imaging tests throughout your pregnancy, such as: Urine tests to check for glucose, protein, or signs of infection. Glucose tests to check for a form of diabetes that can develop during pregnancy (gestational diabetes mellitus). This is usually done around week 24 of pregnancy. Ultrasounds to check your baby's growth and development, to check for birth defects, and to check your baby's well-being. These can also help to decide when you should deliver your baby. A test to check for group B strep (GBS) infection. This is usually done around week 36 of pregnancy. Genetic testing. This may include blood, fluid, or tissue sampling, or imaging tests, such as an ultrasound. Some genetic tests are done during the first trimester and some are done during the second trimester. What else can I expect during prenatal care visits? Your health care provider may recommend getting certain vaccines during pregnancy. These may include: A yearly flu shot (annual influenza vaccine). This is especially important if you will be pregnant during flu season. Tdap (tetanus, diphtheria, pertussis) vaccine. Getting this vaccine during pregnancy can protect your baby from whooping cough (pertussis) after birth. This vaccine may be recommended between weeks 27 and 36 of pregnancy. A COVID-19  vaccine. Later in your pregnancy, your health care provider may give you information about: Childbirth and breastfeeding classes. Choosing a health care provider for your baby. Umbilical cord banking. Breastfeeding. Birth control after your baby is born. The hospital labor and delivery unit and how to set up a tour. Registering at the hospital before you go into labor. Where to find more information Office on Women's Health: womenshealth.gov American Pregnancy Association: americanpregnancy.org March of Dimes: marchofdimes.org Summary Prenatal care helps you and your baby stay as healthy as possible during pregnancy. Your first prenatal care visit will most likely be the longest. You will have visits and tests throughout your pregnancy to monitor your health and your baby's health. Bring a list of questions to your visits to ask your health care provider. Make sure to keep all follow-up and prenatal care visits. This information is not intended to replace advice given to you by your health care provider. Make sure you discuss any questions you have with your healthcare provider. Document Revised: 11/26/2019 Document Reviewed: 11/26/2019 Elsevier Patient Education  2022 Elsevier Inc.  

## 2020-08-31 NOTE — Progress Notes (Signed)
NEW OB HISTORY AND PHYSICAL  SUBJECTIVE:       Alexandria Gomez is a 25 y.o. G2P0010 female, Patient's last menstrual period was 05/26/2020 (exact date)., Estimated Date of Delivery: 03/13/21, [redacted]w[redacted]d, presents today for establishment of Prenatal Care. She has no unusual complaints.    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    Gynecologic History Patient's last menstrual period was 05/26/2020 (exact date). Normal Contraception: none Last Pap: has not had.   Obstetric History OB History  Gravida Para Term Preterm AB Living  2       1    SAB IAB Ectopic Multiple Live Births  1            # Outcome Date GA Lbr Len/2nd Weight Sex Delivery Anes PTL Lv  2 Current           1 SAB             Past Medical History:  Diagnosis Date   Migraines    PCOS (polycystic ovarian syndrome)     Past Surgical History:  Procedure Laterality Date   wisdom teeth removal      Current Outpatient Medications on File Prior to Visit  Medication Sig Dispense Refill   Aspirin-Acetaminophen-Caffeine (EXCEDRIN MIGRAINE PO) Take by mouth as needed.     cetirizine (ZYRTEC) 10 MG tablet Take 10 mg by mouth daily.     Doxylamine-Pyridoxine 10-10 MG TBEC Take 1 tablet by mouth 4 (four) times daily. Day 1 &2: 2 tablet at bedtimeDay 3 : if symptoms persists 1 tablet am; 2 tablet at bedtimeDay 4: 1 tablet am, 1 tab afternoon, 2 tab at bedtime 120 tablet 5   Prenatal Vit-Fe Fum-Fe Bisg-FA (NATACHEW) 28-1 MG CHEW CHEW 1 TABLET BY MOUTH DAILY AT 12 NOON. 30 tablet 11   Prenatal Vit-Fe Fumarate-FA (PRENATAL PLUS VITAMIN/MINERAL) 27-1 MG TABS Take 30 mg by mouth daily. 30 tablet 11   No current facility-administered medications on file prior to visit.    No Known Allergies  Social History   Socioeconomic History   Marital status: Married    Spouse name: Not on file   Number of children: Not on file   Years of education: Not on file    Highest education level: Not on file  Occupational History   Not on file  Tobacco Use   Smoking status: Never   Smokeless tobacco: Never  Substance and Sexual Activity   Alcohol use: Not Currently    Comment: Occasionally   Drug use: Never   Sexual activity: Yes    Partners: Male  Other Topics Concern   Not on file  Social History Narrative   From [place]   Lives with husband Viviann Spare Segarra[place]   Occ: Pensions consultant at [employer]   Edu: 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 [employer]   Activity: no regular exercise   Diet: some water, fruits/vegetables daily   Social Determinants of Health   Financial Resource Strain: Not on file  Food Insecurity: Not on file  Transportation Needs: Not on file  Physical Activity: Not on file  Stress: Not on file  Social Connections: Not on file  Intimate Partner Violence: Not on file    Family History  Problem Relation Age of Onset   Arthritis Mother    Cancer Mother        kidney    Diabetes Mother    Learning disabilities Mother  Miscarriages / [place] Mother    COPD Father    Heart attack Father 5   Alcohol abuse Sister    Depression Sister    Kidney disease Sister    Diabetes Maternal Grandmother    High Cholesterol Maternal Grandmother    Miscarriages / Stillbirths Maternal Grandmother    Diabetes Maternal Grandfather    High blood pressure Maternal Grandfather    Breast cancer Maternal Aunt     The following portions of the patient's history were reviewed and updated as appropriate: allergies, current medications, past OB history, past medical history, past surgical history, past family history, past social history, and problem list.    OBJECTIVE: Initial Physical Exam (New OB)  GENERAL APPEARANCE: alert, well appearing, in no apparent distress, oriented to person, place and time, overweight HEAD: normocephalic, atraumatic MOUTH: mucous membranes moist, pharynx normal without lesions THYROID: no  thyromegaly or masses present BREASTS: no masses noted, no significant tenderness, no palpable axillary nodes, no skin changes, flat nipples LUNGS: clear to auscultation, no wheezes, rales or rhonchi, symmetric air entry HEART: regular rate and rhythm, no murmurs ABDOMEN: soft, nontender, nondistended, no abnormal masses, no epigastric pain, obese, and FHT present EXTREMITIES: no redness or tenderness in the calves or thighs, no edema, no limitation in range of motion, intact peripheral pulses SKIN: normal coloration and turgor, no rashes LYMPH NODES: no adenopathy palpable NEUROLOGIC: alert, oriented, normal speech, no focal findings or movement disorder noted  PELVIC EXAM EXTERNAL GENITALIA: normal appearing vulva with no masses, tenderness or lesions VAGINA: no abnormal discharge or lesions CERVIX: no lesions or cervical motion tenderness UTERUS: gravid ADNEXA: no masses palpable and nontender OB EXAM PELVIMETRY: appears adequate RECTUM: exam not indicated  ASSESSMENT: Normal pregnancy  PLAN: Prenatal care See orders New OB counseling: The patient has been given an overview regarding routine prenatal care. Recommendations regarding diet, weight gain, and exercise in pregnancy were given. Prenatal testing, optional genetic testing, carrier screening, and ultrasound use in pregnancy were reviewed. Panorama today. Benefits of Breast Feeding were discussed. The patient is encouraged to consider nursing her baby post partum.   Doreene Burke, CNM

## 2020-09-01 LAB — CBC
Hematocrit: 38.4 % (ref 34.0–46.6)
Hemoglobin: 12.9 g/dL (ref 11.1–15.9)
MCH: 30.6 pg (ref 26.6–33.0)
MCHC: 33.6 g/dL (ref 31.5–35.7)
MCV: 91 fL (ref 79–97)
Platelets: 237 10*3/uL (ref 150–450)
RBC: 4.21 x10E6/uL (ref 3.77–5.28)
RDW: 12.9 % (ref 11.7–15.4)
WBC: 9.6 10*3/uL (ref 3.4–10.8)

## 2020-09-02 LAB — CYTOLOGY - PAP: Diagnosis: NEGATIVE

## 2020-09-07 ENCOUNTER — Telehealth: Payer: Self-pay

## 2020-09-07 NOTE — Telephone Encounter (Signed)
Spoke with patient's husband in regards to patient's panorama results. Pt requested results be left with front office in an envelope, they will pick results up as soon as possible. No further questions or concerns voiced.

## 2020-09-09 ENCOUNTER — Encounter: Payer: No Typology Code available for payment source | Admitting: Family Medicine

## 2020-09-28 ENCOUNTER — Other Ambulatory Visit: Payer: Self-pay

## 2020-09-28 ENCOUNTER — Ambulatory Visit (INDEPENDENT_AMBULATORY_CARE_PROVIDER_SITE_OTHER): Payer: Medicaid Other | Admitting: Certified Nurse Midwife

## 2020-09-28 DIAGNOSIS — Z3A16 16 weeks gestation of pregnancy: Secondary | ICD-10-CM

## 2020-09-28 DIAGNOSIS — Z3482 Encounter for supervision of other normal pregnancy, second trimester: Secondary | ICD-10-CM

## 2020-09-28 LAB — POCT URINALYSIS DIPSTICK OB
Bilirubin, UA: NEGATIVE
Blood, UA: NEGATIVE
Glucose, UA: NEGATIVE
Ketones, UA: NEGATIVE
Leukocytes, UA: NEGATIVE
Nitrite, UA: NEGATIVE
POC,PROTEIN,UA: NEGATIVE
Spec Grav, UA: 1.01 (ref 1.010–1.025)
Urobilinogen, UA: 0.2 E.U./dL
pH, UA: 7 (ref 5.0–8.0)

## 2020-09-28 NOTE — Progress Notes (Signed)
ROB doing well. Pt state she is feeling some fluttering. Discussed anatomy scan and round ligament pain. Self help measures reviewed. She verbalizes and agrees. Follow up 4 wks or prn .   Doreene Burke, CNM

## 2020-09-28 NOTE — Patient Instructions (Signed)
Round Ligament Pain  The round ligament is a cord of muscle and tissue that helps support the uterus. It can become a source of pain during pregnancy if it becomes stretched or twisted as the baby grows. The pain usually begins in the second trimester (13-28 weeks) of pregnancy, and it can come and go until the baby is delivered.It is not a serious problem, and it does not cause harm to the baby. Round ligament pain is usually a short, sharp, and pinching pain, but it can also be a dull, lingering, and aching pain. The pain is felt in the lower side of the abdomen or in the groin. It usually starts deep in the groin and moves up to the outside of the hip area. The pain may occur when you: Suddenly change position, such as quickly going from a sitting to standing position. Roll over in bed. Cough or sneeze. Do physical activity. Follow these instructions at home:  Watch your condition for any changes. When the pain starts, relax. Then try any of these methods to help with the pain: Sitting down. Flexing your knees up to your abdomen. Lying on your side with one pillow under your abdomen and another pillow between your legs. Sitting in a warm bath for 15-20 minutes or until the pain goes away. Take over-the-counter and prescription medicines only as told by your health care provider. Move slowly when you sit down or stand up. Avoid long walks if they cause pain. Stop or reduce your physical activities if they cause pain. Keep all follow-up visits as told by your health care provider. This is important. Contact a health care provider if: Your pain does not go away with treatment. You feel pain in your back that you did not have before. Your medicine is not helping. Get help right away if: You have a fever or chills. You develop uterine contractions. You have vaginal bleeding. You have nausea or vomiting. You have diarrhea. You have pain when you urinate. Summary Round ligament pain is  felt in the lower abdomen or groin. It is usually a short, sharp, and pinching pain. It can also be a dull, lingering, and aching pain. This pain usually begins in the second trimester (13-28 weeks). It occurs because the uterus is stretching with the growing baby, and it is not harmful to the baby. You may notice the pain when you suddenly change position, when you cough or sneeze, or during physical activity. Relaxing, flexing your knees to your abdomen, lying on one side, or taking a warm bath may help to get rid of the pain. Get help from your health care provider if the pain does not go away or if you have vaginal bleeding, nausea, vomiting, diarrhea, or painful urination. This information is not intended to replace advice given to you by your health care provider. Make sure you discuss any questions you have with your healthcare provider. Document Revised: 01/29/2020 Document Reviewed: 07/31/2017 Elsevier Patient Education  2022 Elsevier Inc.  

## 2020-10-14 ENCOUNTER — Other Ambulatory Visit: Payer: Self-pay

## 2020-10-14 ENCOUNTER — Ambulatory Visit
Admission: RE | Admit: 2020-10-14 | Discharge: 2020-10-14 | Disposition: A | Discharge status: Home / Self Care | Payer: Medicaid Other | Source: Ambulatory Visit | Attending: Certified Nurse Midwife | Admitting: Certified Nurse Midwife

## 2020-10-14 DIAGNOSIS — Z3482 Encounter for supervision of other normal pregnancy, second trimester: Secondary | ICD-10-CM | POA: Insufficient documentation

## 2020-10-26 ENCOUNTER — Other Ambulatory Visit: Payer: Self-pay

## 2020-10-26 ENCOUNTER — Ambulatory Visit (INDEPENDENT_AMBULATORY_CARE_PROVIDER_SITE_OTHER): Payer: Medicaid Other | Admitting: Certified Nurse Midwife

## 2020-10-26 ENCOUNTER — Encounter: Payer: Self-pay | Admitting: Certified Nurse Midwife

## 2020-10-26 VITALS — BP 120/81 | HR 106 | Wt 204.7 lb

## 2020-10-26 DIAGNOSIS — Z3482 Encounter for supervision of other normal pregnancy, second trimester: Secondary | ICD-10-CM

## 2020-10-26 DIAGNOSIS — Z3A20 20 weeks gestation of pregnancy: Secondary | ICD-10-CM

## 2020-10-26 LAB — POCT URINALYSIS DIPSTICK OB
Bilirubin, UA: NEGATIVE
Blood, UA: NEGATIVE
Glucose, UA: NEGATIVE
Ketones, UA: NEGATIVE
Leukocytes, UA: NEGATIVE
Nitrite, UA: NEGATIVE
POC,PROTEIN,UA: NEGATIVE
Spec Grav, UA: 1.015 (ref 1.010–1.025)
Urobilinogen, UA: 0.2 E.U./dL
pH, UA: 6 (ref 5.0–8.0)

## 2020-10-26 NOTE — Patient Instructions (Signed)
Round Ligament Pain The round ligaments are a pair of cord-like tissues that help support the uterus. They can become a source of pain during pregnancy as the ligaments soften and stretch as the baby grows. The pain usually begins in the second trimester (13-28 weeks) of pregnancy, and should only last for a few seconds when it occurs. However, the pain can come and go until the baby is delivered. The pain does not cause harm to the baby. Round ligament pain is usually a short, sharp, and pinching pain, but it can also be a dull, lingering, and aching pain. The pain is felt in the lower side of the abdomen or in the groin. It usually starts deep in the groin and moves up to the outside of the hip area. The pain may happen when you: Suddenly change position, such as quickly going from a sitting to standing position. Do physical activity. Cough or sneeze. Follow these instructions at home: Managing pain  When the pain starts, relax. Then, try any of these methods to help with the pain: Sit down. Flex your knees up to your abdomen. Lie on your side with one pillow under your abdomen and another pillow between your legs. Sit in a warm bath for 15-20 minutes or until the pain goes away. General instructions Watch your condition for any changes. Move slowly when you sit down or stand up. Stop or reduce your physical activities if they cause pain. Avoid long walks if they cause pain. Take over-the-counter and prescription medicines only as told by your health care provider. Keep all follow-up visits. This is important. Contact a health care provider if: Your pain does not go away with treatment. You feel pain in your back that you did not have before. Your medicine is not helping. You have a fever or chills. You have nausea or vomiting. You have diarrhea. You have pain when you urinate. Get help right away if: You have pain that is a rhythmic, cramping pain similar to labor pains. Labor pains  are usually 2 minutes apart, last for about 1 minute, and involve a bearing down feeling or pressure in your pelvis. You have vaginal bleeding. These symptoms may represent a serious problem that is an emergency. Do not wait to see if the symptoms will go away. Get medical help right away. Call your local emergency services (911 in the [place] Do not drive yourself to the hospital. Summary Round ligament pain is felt in the lower abdomen or groin. This pain usually begins in the second trimester (13-28 weeks) and should only last for a few seconds when it occurs. You may notice the pain when you suddenly change position, when you cough or sneeze, or during physical activity. Relaxing, flexing your knees to your abdomen, lying on one side, or taking a warm bath may help to get rid of the pain. Contact your health care provider if the pain does not go away. This information is not intended to replace advice given to you by your health care provider. Make sure you discuss any questions you have with your health care provider. Document Revised: 04/27/2020 Document Reviewed: 04/27/2020 Elsevier Patient Education  2022 Elsevier Inc.  

## 2020-10-26 NOTE — Progress Notes (Signed)
ROB doing well.Feeling some movement.  Discussed round ligament pain . Reassurance given and self help measure reviewed. All questions answered. Follow up 4 wk for prn.     Body mass index is 34.06 kg/m.   Doreene Burke, CNM

## 2020-10-30 LAB — URINE CULTURE

## 2020-11-03 NOTE — Telephone Encounter (Signed)
Called patient to check to see how she was doing. No answer, lvm.

## 2020-11-23 ENCOUNTER — Other Ambulatory Visit: Payer: Self-pay

## 2020-11-23 ENCOUNTER — Encounter: Payer: Self-pay | Admitting: Certified Nurse Midwife

## 2020-11-23 ENCOUNTER — Ambulatory Visit (INDEPENDENT_AMBULATORY_CARE_PROVIDER_SITE_OTHER): Payer: Medicaid Other | Admitting: Certified Nurse Midwife

## 2020-11-23 VITALS — BP 111/75 | HR 109 | Wt 213.6 lb

## 2020-11-23 DIAGNOSIS — Z3A24 24 weeks gestation of pregnancy: Secondary | ICD-10-CM

## 2020-11-23 DIAGNOSIS — Z3482 Encounter for supervision of other normal pregnancy, second trimester: Secondary | ICD-10-CM

## 2020-11-23 LAB — POCT URINALYSIS DIPSTICK OB
Bilirubin, UA: NEGATIVE
Blood, UA: NEGATIVE
Leukocytes, UA: NEGATIVE
Nitrite, UA: NEGATIVE
POC,PROTEIN,UA: NEGATIVE
Spec Grav, UA: 1.02 (ref 1.010–1.025)
Urobilinogen, UA: 0.2 E.U./dL
pH, UA: 6 (ref 5.0–8.0)

## 2020-11-23 NOTE — Progress Notes (Signed)
ROB doing well, feeling good movement. Pt asked about "pregnancy brain" state she feel forgetful. Also notices that some days her vision is worse than others.  Reassurance give. Discussed visual changes in relationship to pre-e she denies. Discussed Glucose screen next visit. Information sheet given. Follow up 4 wk or prn .   Doreene Burke, CNM

## 2020-11-23 NOTE — Patient Instructions (Signed)
Td (Tetanus, Diphtheria) Vaccine: What You Need to Know 1. Why get vaccinated? Td vaccine can prevent tetanus and diphtheria. Tetanus enters the body through cuts or wounds. Diphtheria spreads from person to person. TETANUS (T) causes painful stiffening of the muscles. Tetanus can lead to serious health problems, including being unable to open the mouth, having trouble swallowing and breathing, or death. DIPHTHERIA (D) can lead to difficulty breathing, heart failure, paralysis, or death. 2. Td vaccine Td is only for children 7 years and older, adolescents, and adults.  Td is usually given as a booster dose every 10 years, or after 5 years in the case of a severe or dirty wound or burn. Another vaccine, called "Tdap," may be used instead of Td. Tdap protects against pertussis, also known as "whooping cough," in addition to tetanus anddiphtheria. Td may be given at the same time as other vaccines. 3. Talk with your health care provider Tell your vaccination provider if the person getting the vaccine: Has had an allergic reaction after a previous dose of any vaccine that protects against tetanus or diphtheria, or has any severe, life-threatening allergies Has ever had Guillain-Barr Syndrome (also called "GBS") Has had severe pain or swelling after a previous dose of any vaccine that protects against tetanus or diphtheria In some cases, your health care provider may decide to postpone Td vaccinationuntil a future visit. People with minor illnesses, such as a cold, may be vaccinated. People who are moderately or severely ill should usually wait until they recover beforegetting Td vaccine.  Your health care provider can give you more information. 4. Risks of a vaccine reaction Pain, redness, or swelling where the shot was given, mild fever, headache, feeling tired, and nausea, vomiting, diarrhea, or stomachache sometimes happen after Td vaccination. People sometimes faint after medical procedures,  including vaccination. Tellyour provider if you feel dizzy or have vision changes or ringing in the ears.  As with any medicine, there is a very remote chance of a vaccine causing asevere allergic reaction, other serious injury, or death. 5. What if there is a serious problem? An allergic reaction could occur after the vaccinated person leaves the clinic. If you see signs of a severe allergic reaction (hives, swelling of the face and throat, difficulty breathing, a fast heartbeat, dizziness, or weakness), call 9-1-1and get the person to the nearest hospital.  For other signs that concern you, call your health care provider.  Adverse reactions should be reported to the Vaccine Adverse Event Reporting System (VAERS). Your health care provider will usually file this report, or you can do it yourself. Visit the VAERS website at www.vaers.hhs.gov or call 1-800-822-7967. VAERS is only for reporting reactions, and VAERS staff members do not give medical advice. 6. The National Vaccine Injury Compensation Program The National Vaccine Injury Compensation Program (VICP) is a federal program that was created to compensate people who may have been injured by certain vaccines. Claims regarding alleged injury or death due to vaccination have a time limit for filing, which may be as short as two years. Visit the VICP website at www.hrsa.gov/vaccinecompensation or call 1-800-338-2382to learn about the program and about filing a claim. 7. How can I learn more? Ask your health care provider. Call your local or state health department. Visit the website of the Food and Drug Administration (FDA) for vaccine package inserts and additional information at www.fda.gov/vaccines-blood-biologics/vaccines. Contact the Centers for Disease Control and Prevention (CDC): Call 1-800-232-4636 (1-800-CDC-INFO) or Visit CDC's website at www.cdc.gov/vaccines. Vaccine Information Statement   Td (Tetanus, Diphtheria) Vaccine (10/02/2019) This  information is not intended to replace advice given to you by your health care provider. Make sure you discuss any questions you have with your healthcare provider. Document Revised: 11/19/2019 Document Reviewed: 11/19/2019 Elsevier Patient Education  2022 Elsevier Inc. Oral Glucose Tolerance Test During Pregnancy Why am I having this test? The oral glucose tolerance test (OGTT) is done to check how your body processes blood sugar (glucose). This is one of several tests used to diagnose diabetes that develops during pregnancy (gestational diabetes mellitus). Gestational diabetes is a short-term form of diabetes that some women develop while they are pregnant. It usually occurs during the second trimesterof pregnancy and goes away after delivery. Testing, or screening, for gestational diabetes usually occurs at weeks 24-28 of pregnancy. You may have the OGTT test after having a 1-hour glucose screening test if the results from that test indicate that you may have gestational diabetes. This test may also be needed if: You have a history of gestational diabetes. There is a history of giving birth to very large babies or of losing pregnancies (having stillbirths). You have signs and symptoms of diabetes, such as: Changes in your eyesight. Tingling or numbness in your hands or feet. Changes in hunger, thirst, and urination, and these are not explained by your pregnancy. What is being tested? This test measures the amount of glucose in your blood at different timesduring a period of 3 hours. This shows how well your body can process glucose. What kind of sample is taken?  Blood samples are required for this test. They are usually collected byinserting a needle into a blood vessel. How do I prepare for this test? For 3 days before your test, eat normally. Have plenty of carbohydrate-rich foods. Follow instructions from your health care provider about: Eating or drinking restrictions on the day of the  test. You may be asked not to eat or drink anything other than water (to fast) starting 8-10 hours before the test. Changing or stopping your regular medicines. Some medicines may interfere with this test. Tell a health care provider about: All medicines you are taking, including vitamins, herbs, eye drops, creams, and over-the-counter medicines. Any blood disorders you have. Any surgeries you have had. Any medical conditions you have. What happens during the test? First, your blood glucose will be measured. This is referred to as your fasting blood glucose because you fasted before the test. Then, you will drink a glucose solution that contains a certain amount of glucose. Your blood glucosewill be measured again 1, 2, and 3 hours after you drink the solution. This test takes about 3 hours to complete. You will need to stay at the testing location during this time. During the testing period: Do not eat or drink anything other than the glucose solution. Do not exercise. Do not use any products that contain nicotine or tobacco, such as cigarettes, e-cigarettes, and chewing tobacco. These can affect your test results. If you need help quitting, ask your health care provider. The testing procedure may vary among health care providers and hospitals. How are the results reported? Your results will be reported as milligrams of glucose per deciliter of blood (mg/dL) or millimoles per liter (mmol/L). There is more than one source for screening and diagnosis reference values used to diagnose gestational diabetes. Your health care provider will compare your results to normal values that were established after testing a large group of people (reference values). Reference values may vary among labs   and hospitals. For this test (Carpenter-Coustan), reference values are: Fasting: 95 mg/dL (5.3 mmol/L). 1 hour: 180 mg/dL (10.0 mmol/L). 2 hour: 155 mg/dL (8.6 mmol/L). 3 hour: 140 mg/dL (7.8 mmol/L). What do the  results mean? Results below the reference values are considered normal. If two or more of your blood glucose levels are at or above the reference values, you may be diagnosed with gestational diabetes. If only one level is high, your healthcare provider may suggest repeat testing or other tests to confirm a diagnosis. Talk with your health care provider about what your results mean. Questions to ask your health care provider Ask your health care provider, or the department that is doing the test: When will my results be ready? How will I get my results? What are my treatment options? What other tests do I need? What are my next steps? Summary The oral glucose tolerance test (OGTT) is one of several tests used to diagnose diabetes that develops during pregnancy (gestational diabetes mellitus). Gestational diabetes is a short-term form of diabetes that some women develop while they are pregnant. You may have the OGTT test after having a 1-hour glucose screening test if the results from that test show that you may have gestational diabetes. You may also have this test if you have any symptoms or risk factors for this type of diabetes. Talk with your health care provider about what your results mean. This information is not intended to replace advice given to you by your health care provider. Make sure you discuss any questions you have with your healthcare provider. Document Revised: 07/23/2019 Document Reviewed: 07/23/2019 Elsevier Patient Education  2022 Elsevier Inc.  

## 2020-11-27 ENCOUNTER — Other Ambulatory Visit: Payer: Self-pay

## 2020-11-27 ENCOUNTER — Encounter: Payer: Self-pay | Admitting: Obstetrics and Gynecology

## 2020-11-27 ENCOUNTER — Observation Stay
Admission: EM | Admit: 2020-11-27 | Discharge: 2020-11-27 | Disposition: A | Discharge status: Home / Self Care | Payer: Medicaid Other | Attending: Certified Nurse Midwife | Admitting: Certified Nurse Midwife

## 2020-11-27 DIAGNOSIS — O2692 Pregnancy related conditions, unspecified, second trimester: Secondary | ICD-10-CM

## 2020-11-27 DIAGNOSIS — O26892 Other specified pregnancy related conditions, second trimester: Principal | ICD-10-CM | POA: Insufficient documentation

## 2020-11-27 DIAGNOSIS — R109 Unspecified abdominal pain: Secondary | ICD-10-CM | POA: Diagnosis not present

## 2020-11-27 DIAGNOSIS — Z3A24 24 weeks gestation of pregnancy: Secondary | ICD-10-CM | POA: Diagnosis not present

## 2020-11-27 DIAGNOSIS — M549 Dorsalgia, unspecified: Secondary | ICD-10-CM | POA: Diagnosis not present

## 2020-11-27 DIAGNOSIS — N390 Urinary tract infection, site not specified: Secondary | ICD-10-CM | POA: Diagnosis not present

## 2020-11-27 DIAGNOSIS — O2340 Unspecified infection of urinary tract in pregnancy, unspecified trimester: Secondary | ICD-10-CM | POA: Insufficient documentation

## 2020-11-27 MED ORDER — ACETAMINOPHEN 500 MG PO TABS
1000.0000 mg | ORAL_TABLET | ORAL | Status: DC | PRN
  Administered 2020-11-27: 1000 mg via ORAL
  Filled 2020-11-27: qty 2

## 2020-11-27 NOTE — OB Triage Note (Addendum)
Alexandria Gomez is a 25yo G2P0, 24w 6d. She arrived to the unit with complaints of  abdominal pain. She denies vaginal bleeding, reports positive fetal movement. VS stable, monitors applied and assessing.   Initial FHT 150 at 0222.

## 2020-11-27 NOTE — OB Triage Note (Signed)
    L&D OB Triage Note  SUBJECTIVE Alexandria Gomez is a 25 y.o. G2P0010 female at [redacted]w[redacted]d, EDD Estimated Date of Delivery: 03/13/21 who presented to triage with complaints of back pain. She feels good movement , denies LOF and vaginal bleeding.   OB History  Gravida Para Term Preterm AB Living  2 0 0 0 1 0  SAB IAB Ectopic Multiple Live Births  1 0 0 0 0    # Outcome Date GA Lbr Len/2nd Weight Sex Delivery Anes PTL Lv  2 Current           1 SAB             Medications Prior to Admission  Medication Sig Dispense Refill Last Dose   Prenatal Vit-Fe Fum-Fe Bisg-FA (NATACHEW) 28-1 MG CHEW CHEW 1 TABLET BY MOUTH DAILY AT 12 NOON. 30 tablet 11 11/26/2020   Prenatal Vit-Fe Fumarate-FA (PRENATAL PLUS VITAMIN/MINERAL) 27-1 MG TABS Take 30 mg by mouth daily. 30 tablet 11 11/26/2020   Aspirin-Acetaminophen-Caffeine (EXCEDRIN MIGRAINE PO) Take by mouth as needed. (Patient not taking: Reported on 11/27/2020)   Not Taking   cetirizine (ZYRTEC) 10 MG tablet Take 10 mg by mouth daily. (Patient not taking: Reported on 11/27/2020)   Not Taking   Doxylamine-Pyridoxine 10-10 MG TBEC Take 1 tablet by mouth 4 (four) times daily. Day 1 &2: 2 tablet at bedtimeDay 3 : if symptoms persists 1 tablet am; 2 tablet at bedtimeDay 4: 1 tablet am, 1 tab afternoon, 2 tab at bedtime (Patient not taking: Reported on 11/27/2020) 120 tablet 5 Not Taking     OBJECTIVE  Nursing Evaluation:   BP 135/82 (BP Location: Left Arm)   Pulse (!) 115   Temp 99.4 F (37.4 C) (Oral)   Resp 16   Ht 5\' 5"  (1.651 m)   Wt 96.9 kg   LMP 05/26/2020 (Exact Date)   BMI 35.54 kg/m    Findings:        Reactive NST, no contractions      NST was performed and has been reviewed by me.  NST INTERPRETATION: Category I   Baseline 150 Moderate Variability Accelerations present Decelerations absent  Ctx: absent    ASSESSMENT Impression:  1.  Pregnancy:  G2P0010 at [redacted]w[redacted]d , EDD Estimated Date of Delivery: 03/13/21 2.  Reassuring  fetal and maternal status 3. Back pain in pregnancy, improved with tylenol      PLAN 1. Current condition and above findings reviewed.  Reassuring fetal and maternal condition. 2. Discharge home with standard labor precautions given to return to L&D or call the office for problems. Use tylenol prn , ice , and heat as needed 3. Continue routine prenatal care.   03/15/21, CNM

## 2020-11-27 NOTE — Progress Notes (Signed)
Discharge instructions provided to pt. Pt verbalizes understanding. Vaginal bleeding and discharge, contractions, and fetal movement reviewed by RN. Hydration encouraged. Pt discharged home with significant other VS stable.

## 2020-11-28 LAB — URINE CULTURE

## 2020-12-21 ENCOUNTER — Ambulatory Visit (INDEPENDENT_AMBULATORY_CARE_PROVIDER_SITE_OTHER): Payer: Medicaid Other | Admitting: Certified Nurse Midwife

## 2020-12-21 ENCOUNTER — Other Ambulatory Visit: Payer: Medicaid Other

## 2020-12-21 ENCOUNTER — Other Ambulatory Visit: Payer: Self-pay

## 2020-12-21 VITALS — BP 123/86 | HR 116 | Wt 219.4 lb

## 2020-12-21 DIAGNOSIS — Z3483 Encounter for supervision of other normal pregnancy, third trimester: Secondary | ICD-10-CM

## 2020-12-21 DIAGNOSIS — Z3A28 28 weeks gestation of pregnancy: Secondary | ICD-10-CM

## 2020-12-21 DIAGNOSIS — Z23 Encounter for immunization: Secondary | ICD-10-CM | POA: Diagnosis not present

## 2020-12-21 LAB — POCT URINALYSIS DIPSTICK OB
Bilirubin, UA: NEGATIVE
Blood, UA: NEGATIVE
Glucose, UA: NEGATIVE
Ketones, UA: NEGATIVE
Leukocytes, UA: NEGATIVE
Nitrite, UA: NEGATIVE
POC,PROTEIN,UA: NEGATIVE
Spec Grav, UA: 1.01 (ref 1.010–1.025)
Urobilinogen, UA: 0.2 E.U./dL
pH, UA: 7 (ref 5.0–8.0)

## 2020-12-21 MED ORDER — TETANUS-DIPHTH-ACELL PERTUSSIS 5-2.5-18.5 LF-MCG/0.5 IM SUSY
0.5000 mL | PREFILLED_SYRINGE | Freq: Once | INTRAMUSCULAR | Status: AC
  Administered 2020-12-21: 0.5 mL via INTRAMUSCULAR

## 2020-12-21 NOTE — Addendum Note (Signed)
Addended by: Mechele Claude on: 12/21/2020 09:58 AM   Modules accepted: Orders

## 2020-12-21 NOTE — Patient Instructions (Signed)
Oral Glucose Tolerance Test During Pregnancy °Why am I having this test? °The oral glucose tolerance test (OGTT) is done to check how your body processes blood sugar (glucose). This is one of several tests used to diagnose diabetes that develops during pregnancy (gestational diabetes mellitus). Gestational diabetes is a short-term form of diabetes that some women develop while they are pregnant. It usually occurs during the second trimester of pregnancy and goes away after delivery. °Testing, or screening, for gestational diabetes usually occurs at weeks 24-28 of pregnancy. You may have the OGTT test after having a 1-hour glucose screening test if the results from that test indicate that you may have gestational diabetes. This test may also be needed if: °You have a history of gestational diabetes. °There is a history of giving birth to very large babies or of losing pregnancies (having stillbirths). °You have signs and symptoms of diabetes, such as: °Changes in your eyesight. °Tingling or numbness in your hands or feet. °Changes in hunger, thirst, and urination, and these are not explained by your pregnancy. °What is being tested? °This test measures the amount of glucose in your blood at different times during a period of 3 hours. This shows how well your body can process glucose. °What kind of sample is taken? °Blood samples are required for this test. They are usually collected by inserting a needle into a blood vessel. °How do I prepare for this test? °For 3 days before your test, eat normally. Have plenty of carbohydrate-rich foods. °Follow instructions from your health care provider about: °Eating or drinking restrictions on the day of the test. You may be asked not to eat or drink anything other than water (to fast) starting 8-10 hours before the test. °Changing or stopping your regular medicines. Some medicines may interfere with this test. °Tell a health care provider about: °All medicines you are taking,  including vitamins, herbs, eye drops, creams, and over-the-counter medicines. °Any blood disorders you have. °Any surgeries you have had. °Any medical conditions you have. °What happens during the test? °First, your blood glucose will be measured. This is referred to as your fasting blood glucose because you fasted before the test. Then, you will drink a glucose solution that contains a certain amount of glucose. Your blood glucose will be measured again 1, 2, and 3 hours after you drink the solution. °This test takes about 3 hours to complete. You will need to stay at the testing location during this time. During the testing period: °Do not eat or drink anything other than the glucose solution. °Do not exercise. °Do not use any products that contain nicotine or tobacco, such as cigarettes, e-cigarettes, and chewing tobacco. These can affect your test results. If you need help quitting, ask your health care provider. °The testing procedure may vary among health care providers and hospitals. °How are the results reported? °Your results will be reported as milligrams of glucose per deciliter of blood (mg/dL) or millimoles per liter (mmol/L). There is more than one source for screening and diagnosis reference values used to diagnose gestational diabetes. Your health care provider will compare your results to normal values that were established after testing a large group of people (reference values). Reference values may vary among labs and hospitals. For this test (Carpenter-Coustan), reference values are: °Fasting: 95 mg/dL (5.3 mmol/L). °1 hour: 180 mg/dL (10.0 mmol/L). °2 hour: 155 mg/dL (8.6 mmol/L). °3 hour: 140 mg/dL (7.8 mmol/L). °What do the results mean? °Results below the reference values are considered   normal. If two or more of your blood glucose levels are at or above the reference values, you may be diagnosed with gestational diabetes. If only one level is high, your health care provider may suggest  repeat testing or other tests to confirm a diagnosis. °Talk with your health care provider about what your results mean. °Questions to ask your health care provider °Ask your health care provider, or the department that is doing the test: °When will my results be ready? °How will I get my results? °What are my treatment options? °What other tests do I need? °What are my next steps? °Summary °The oral glucose tolerance test (OGTT) is one of several tests used to diagnose diabetes that develops during pregnancy (gestational diabetes mellitus). Gestational diabetes is a short-term form of diabetes that some women develop while they are pregnant. °You may have the OGTT test after having a 1-hour glucose screening test if the results from that test show that you may have gestational diabetes. You may also have this test if you have any symptoms or risk factors for this type of diabetes. °Talk with your health care provider about what your results mean. °This information is not intended to replace advice given to you by your health care provider. Make sure you discuss any questions you have with your health care provider. °Document Revised: 07/23/2019 Document Reviewed: 07/23/2019 °Elsevier Patient Education © 2022 Elsevier Inc. ° °

## 2020-12-21 NOTE — Progress Notes (Addendum)
ROB doing well. Feels good movement. 28 wk labs today: Glucose screen/RPR/CBC. Tdap, Blood transfusion consent completed, all questions answered. Ready set baby reviewed, see check list for topics covered. Sample birth plan given, will follow up in upcoming visits. Discussed birth control after delivery, information pamphlet given.   Pt was at hospital 2 wks ago with back pain , urine at hospital suggest recollection. Urine culture sent .   Follow up 2 wk for ROB or sooner if needed.    Doreene Burke, CNM

## 2020-12-22 ENCOUNTER — Other Ambulatory Visit: Payer: Self-pay | Admitting: Certified Nurse Midwife

## 2020-12-22 DIAGNOSIS — O99810 Abnormal glucose complicating pregnancy: Secondary | ICD-10-CM

## 2020-12-22 LAB — CBC
Hematocrit: 35.7 % (ref 34.0–46.6)
Hemoglobin: 12 g/dL (ref 11.1–15.9)
MCH: 30.4 pg (ref 26.6–33.0)
MCHC: 33.6 g/dL (ref 31.5–35.7)
MCV: 90 fL (ref 79–97)
Platelets: 185 10*3/uL (ref 150–450)
RBC: 3.95 x10E6/uL (ref 3.77–5.28)
RDW: 12.1 % (ref 11.7–15.4)
WBC: 7.2 10*3/uL (ref 3.4–10.8)

## 2020-12-22 LAB — RPR: RPR Ser Ql: NONREACTIVE

## 2020-12-22 LAB — GLUCOSE, 1 HOUR GESTATIONAL: Gestational Diabetes Screen: 163 mg/dL — ABNORMAL HIGH (ref 70–139)

## 2020-12-23 LAB — CULTURE, OB URINE

## 2020-12-23 LAB — URINE CULTURE, OB REFLEX

## 2020-12-23 NOTE — Progress Notes (Signed)
Pt.notified

## 2021-01-02 ENCOUNTER — Other Ambulatory Visit: Payer: Medicaid Other

## 2021-01-02 ENCOUNTER — Other Ambulatory Visit: Payer: Self-pay

## 2021-01-02 DIAGNOSIS — O99810 Abnormal glucose complicating pregnancy: Secondary | ICD-10-CM

## 2021-01-03 ENCOUNTER — Ambulatory Visit (INDEPENDENT_AMBULATORY_CARE_PROVIDER_SITE_OTHER): Payer: Medicaid Other | Admitting: Certified Nurse Midwife

## 2021-01-03 VITALS — BP 117/82 | HR 111 | Wt 220.6 lb

## 2021-01-03 DIAGNOSIS — Z3483 Encounter for supervision of other normal pregnancy, third trimester: Secondary | ICD-10-CM

## 2021-01-03 DIAGNOSIS — Z3A30 30 weeks gestation of pregnancy: Secondary | ICD-10-CM

## 2021-01-03 LAB — POCT URINALYSIS DIPSTICK OB
Bilirubin, UA: NEGATIVE
Blood, UA: NEGATIVE
Glucose, UA: NEGATIVE
Ketones, UA: NEGATIVE
Leukocytes, UA: NEGATIVE
Nitrite, UA: NEGATIVE
POC,PROTEIN,UA: NEGATIVE
Spec Grav, UA: 1.015 (ref 1.010–1.025)
Urobilinogen, UA: 0.2 E.U./dL
pH, UA: 7 (ref 5.0–8.0)

## 2021-01-03 LAB — GESTATIONAL GLUCOSE TOLERANCE
Glucose, Fasting: 81 mg/dL (ref 70–94)
Glucose, GTT - 1 Hour: 210 mg/dL — ABNORMAL HIGH (ref 70–179)
Glucose, GTT - 2 Hour: 146 mg/dL (ref 70–154)
Glucose, GTT - 3 Hour: 88 mg/dL (ref 70–139)

## 2021-01-03 NOTE — Progress Notes (Signed)
ROB doing well, pt had 3 hr glucose completed yesterday, results not available yet. Will follow up. Birth plan reviewed. Copy to chart. Discussed kick counts. She plans to wait and see for pain medications, she is breastfeeding and will use nexplanon pp for birth control.   Return in about 2 weeks (around 01/17/2021) for ROB with Shundra Wirsing .

## 2021-01-03 NOTE — Patient Instructions (Signed)
[facility]  [facility]  [address], [place], [place] [address]  Phone: [phone]  [facility] (second location)  [address] [place], [place] [address]  Phone: [phone]  [facility] ([place]) [address], [place], [place] [address] Phone: [phone]  [facility]  [address] [place],  [address]  Phone: [phone] 

## 2021-01-17 ENCOUNTER — Other Ambulatory Visit: Payer: Self-pay

## 2021-01-17 ENCOUNTER — Ambulatory Visit (INDEPENDENT_AMBULATORY_CARE_PROVIDER_SITE_OTHER): Payer: Medicaid Other | Admitting: Certified Nurse Midwife

## 2021-01-17 VITALS — BP 119/82 | HR 123 | Wt 224.6 lb

## 2021-01-17 DIAGNOSIS — Z3483 Encounter for supervision of other normal pregnancy, third trimester: Secondary | ICD-10-CM

## 2021-01-17 DIAGNOSIS — Z3A32 32 weeks gestation of pregnancy: Secondary | ICD-10-CM

## 2021-01-17 LAB — POCT URINALYSIS DIPSTICK OB
Bilirubin, UA: NEGATIVE
Blood, UA: NEGATIVE
Glucose, UA: NEGATIVE
Ketones, UA: NEGATIVE
Leukocytes, UA: NEGATIVE
Nitrite, UA: NEGATIVE
POC,PROTEIN,UA: NEGATIVE
Spec Grav, UA: 1.01 (ref 1.010–1.025)
Urobilinogen, UA: 0.2 E.U./dL
pH, UA: 7.5 (ref 5.0–8.0)

## 2021-01-17 NOTE — Progress Notes (Signed)
ROB doing well, Feeling regular fetal movement. Discussed fetal movement in relationship to well being. She verbalizes understanding. Discussed GBS testing 36 wks. Pt having some swelling, self help measures reviewed, PRE E S&S reviewed. Having some occasion of dizziness. Discussed sitting and standing slowly, hydration, frequent snacks for BS control. She verbalizes and agrees. Follow up 2 wk   Doreene Burke, CNM

## 2021-01-17 NOTE — Patient Instructions (Signed)
Fetal Movement Counts Patient Name: ________________________________________________ Patient DueDate: ____________________ What is a fetal movement count?  A fetal movement count is the number of times that you feel your baby move during a certain amount of time. This may also be called a fetal kick count. A fetal movement count is recommended for every pregnant woman. You may be askedto start counting fetal movements as early as week 28 of your pregnancy. Pay attention to when your baby is most active. You may notice your baby's sleep and wake cycles. You may also notice things that make your baby move more. You should do a fetal movement count: When your baby is normally most active. At the same time each day. A good time to count movements is while you are resting, after having somethingto eat and drink. How do I count fetal movements? Find a quiet, comfortable area. Sit, or lie down on your side. Write down the date, the start time and stop time, and the number of movements that you felt between those two times. Take this information with you to your health care visits. Write down your start time when you feel the first movement. Count kicks, flutters, swishes, rolls, and jabs. You should feel at least 10 movements. You may stop counting after you have felt 10 movements, or if you have been counting for 2 hours. Write down the stop time. If you do not feel 10 movements in 2 hours, contact your health care provider for further instructions. Your health care provider may want to do additional tests to assess your baby's well-being. Contact a health care provider if: You feel fewer than 10 movements in 2 hours. Your baby is not moving like he or she usually does. Date: ____________ Start time: ____________ Stop time: ____________ Movements:____________ Date: ____________ Start time: ____________ Stop time: ____________ Movements:____________ Date: ____________ Start time: ____________ Stop  time: ____________ Movements:____________ Date: ____________ Start time: ____________ Stop time: ____________ Movements:____________ Date: ____________ Start time: ____________ Stop time: ____________ Movements:____________ Date: ____________ Start time: ____________ Stop time: ____________ Movements:____________ Date: ____________ Start time: ____________ Stop time: ____________ Movements:____________ Date: ____________ Start time: ____________ Stop time: ____________ Movements:____________ Date: ____________ Start time: ____________ Stop time: ____________ Movements:____________ This information is not intended to replace advice given to you by your health care provider. Make sure you discuss any questions you have with your healthcare provider. Document Revised: 10/02/2018 Document Reviewed: 10/02/2018 Elsevier Patient Education  2022 Elsevier Inc.  

## 2021-01-23 ENCOUNTER — Ambulatory Visit (INDEPENDENT_AMBULATORY_CARE_PROVIDER_SITE_OTHER): Payer: Self-pay | Admitting: Pediatrics

## 2021-01-23 ENCOUNTER — Other Ambulatory Visit: Payer: Self-pay

## 2021-01-23 DIAGNOSIS — Z7681 Expectant parent(s) prebirth pediatrician visit: Secondary | ICD-10-CM

## 2021-01-29 NOTE — Progress Notes (Signed)
Prenatal counseling for impending newborn done--  Reviewed current vaccine policy and answered all questions.  1st child, Currently 32 weeks, Current complications:  none, Prenatal care initiated:  7wks Z76.81

## 2021-01-31 ENCOUNTER — Ambulatory Visit (INDEPENDENT_AMBULATORY_CARE_PROVIDER_SITE_OTHER): Payer: Medicaid Other | Admitting: Certified Nurse Midwife

## 2021-01-31 ENCOUNTER — Other Ambulatory Visit: Payer: Self-pay

## 2021-01-31 VITALS — BP 126/86 | Wt 228.8 lb

## 2021-01-31 DIAGNOSIS — Z23 Encounter for immunization: Secondary | ICD-10-CM

## 2021-01-31 DIAGNOSIS — Z3483 Encounter for supervision of other normal pregnancy, third trimester: Secondary | ICD-10-CM

## 2021-01-31 DIAGNOSIS — Z3A34 34 weeks gestation of pregnancy: Secondary | ICD-10-CM

## 2021-01-31 LAB — POCT URINALYSIS DIPSTICK OB
Leukocytes, UA: NEGATIVE
Spec Grav, UA: <=1.005 — AB (ref 1.010–1.025)
pH, UA: 6.5 (ref 5.0–8.0)

## 2021-01-31 NOTE — Patient Instructions (Signed)

## 2021-01-31 NOTE — Progress Notes (Signed)
ROB doing well, feeling good movement. Discussed swabs next visit. She verbalizes and agrees. Size measuring greater than dates, u/s ordered for growth next visit. Pt requetign flu shot today. Follow up 2 wks.

## 2021-02-03 ENCOUNTER — Telehealth: Payer: Self-pay | Admitting: Certified Nurse Midwife

## 2021-02-03 ENCOUNTER — Other Ambulatory Visit: Payer: Self-pay

## 2021-02-03 ENCOUNTER — Ambulatory Visit (INDEPENDENT_AMBULATORY_CARE_PROVIDER_SITE_OTHER): Payer: Medicaid Other

## 2021-02-03 DIAGNOSIS — Z3483 Encounter for supervision of other normal pregnancy, third trimester: Secondary | ICD-10-CM | POA: Diagnosis not present

## 2021-02-03 DIAGNOSIS — Z3A34 34 weeks gestation of pregnancy: Secondary | ICD-10-CM | POA: Diagnosis not present

## 2021-02-03 NOTE — Telephone Encounter (Signed)
Pt wanted to [place] to relay that she has been having swelling in feet- denies headache or high blood pressure. Please Advise.

## 2021-02-15 ENCOUNTER — Other Ambulatory Visit: Payer: Self-pay

## 2021-02-15 ENCOUNTER — Ambulatory Visit (INDEPENDENT_AMBULATORY_CARE_PROVIDER_SITE_OTHER): Payer: Medicaid Other | Admitting: Certified Nurse Midwife

## 2021-02-15 ENCOUNTER — Other Ambulatory Visit: Payer: Self-pay | Admitting: Certified Nurse Midwife

## 2021-02-15 ENCOUNTER — Encounter: Payer: Self-pay | Admitting: Obstetrics

## 2021-02-15 VITALS — BP 125/86 | HR 114 | Wt 235.6 lb

## 2021-02-15 DIAGNOSIS — Z3483 Encounter for supervision of other normal pregnancy, third trimester: Secondary | ICD-10-CM

## 2021-02-15 DIAGNOSIS — Z3A36 36 weeks gestation of pregnancy: Secondary | ICD-10-CM | POA: Diagnosis not present

## 2021-02-15 DIAGNOSIS — Z113 Encounter for screening for infections with a predominantly sexual mode of transmission: Secondary | ICD-10-CM

## 2021-02-15 LAB — POCT URINALYSIS DIPSTICK OB
Bilirubin, UA: NEGATIVE
Blood, UA: NEGATIVE
Glucose, UA: NEGATIVE
Ketones, UA: NEGATIVE
Leukocytes, UA: NEGATIVE
Nitrite, UA: NEGATIVE
POC,PROTEIN,UA: NEGATIVE
Spec Grav, UA: 1.02 (ref 1.010–1.025)
Urobilinogen, UA: 0.2 E.U./dL
pH, UA: 7 (ref 5.0–8.0)

## 2021-02-15 NOTE — Progress Notes (Signed)
ROB at 36+2. Alexandria Gomez is feeling well Good fetal movement. Having a few BH contractions. Denies LOF. Reviewed labor precautions and when to go to the hospital. GC/Chlamydia collected today. Reviewed GBS pos in urine and antibiotic treatment in labor. Discussed comfort measures for swelling in feet and ankles. Will repeat growth [place] in 2 weeks.  Alexandria Gomez Spanish, CNM

## 2021-02-15 NOTE — Patient Instructions (Signed)

## 2021-02-19 ENCOUNTER — Observation Stay
Admission: EM | Admit: 2021-02-19 | Discharge: 2021-02-19 | Disposition: A | Discharge status: Home / Self Care | Payer: Medicaid Other | Attending: Certified Nurse Midwife | Admitting: Certified Nurse Midwife

## 2021-02-19 ENCOUNTER — Encounter: Payer: Self-pay | Admitting: Obstetrics and Gynecology

## 2021-02-19 DIAGNOSIS — O26893 Other specified pregnancy related conditions, third trimester: Secondary | ICD-10-CM | POA: Diagnosis not present

## 2021-02-19 DIAGNOSIS — N898 Other specified noninflammatory disorders of vagina: Secondary | ICD-10-CM | POA: Diagnosis not present

## 2021-02-19 DIAGNOSIS — O368130 Decreased fetal movements, third trimester, not applicable or unspecified: Secondary | ICD-10-CM | POA: Insufficient documentation

## 2021-02-19 DIAGNOSIS — Z3A36 36 weeks gestation of pregnancy: Secondary | ICD-10-CM | POA: Diagnosis not present

## 2021-02-19 LAB — CBC
HCT: 35.1 % — ABNORMAL LOW (ref 36.0–46.0)
Hemoglobin: 12.1 g/dL (ref 12.0–15.0)
MCH: 30.4 pg (ref 26.0–34.0)
MCHC: 34.5 g/dL (ref 30.0–36.0)
MCV: 88.2 fL (ref 80.0–100.0)
Platelets: 184 10*3/uL (ref 150–400)
RBC: 3.98 MIL/uL (ref 3.87–5.11)
RDW: 12.9 % (ref 11.5–15.5)
WBC: 8.5 10*3/uL (ref 4.0–10.5)
nRBC: 0 % (ref 0.0–0.2)

## 2021-02-19 LAB — COMPREHENSIVE METABOLIC PANEL
ALT: 10 U/L (ref 0–44)
AST: 16 U/L (ref 15–41)
Albumin: 2.8 g/dL — ABNORMAL LOW (ref 3.5–5.0)
Alkaline Phosphatase: 140 U/L — ABNORMAL HIGH (ref 38–126)
Anion gap: 7 (ref 5–15)
BUN: 8 mg/dL (ref 6–20)
CO2: 21 mmol/L — ABNORMAL LOW (ref 22–32)
Calcium: 8.6 mg/dL — ABNORMAL LOW (ref 8.9–10.3)
Chloride: 106 mmol/L (ref 98–111)
Creatinine, Ser: 0.48 mg/dL (ref 0.44–1.00)
GFR, Estimated: >60 mL/min (ref >60)
Glucose, Bld: 147 mg/dL — ABNORMAL HIGH (ref 70–99)
Potassium: 3.8 mmol/L (ref 3.5–5.1)
Sodium: 134 mmol/L — ABNORMAL LOW (ref 135–145)
Total Bilirubin: 0.5 mg/dL (ref 0.3–1.2)
Total Protein: 6.6 g/dL (ref 6.5–8.1)

## 2021-02-19 LAB — WET PREP, GENITAL
Clue Cells Wet Prep HPF POC: NONE SEEN
Sperm: NONE SEEN
Trich, Wet Prep: NONE SEEN
WBC, Wet Prep HPF POC: <10 — AB (ref <10)
Yeast Wet Prep HPF POC: NONE SEEN

## 2021-02-19 LAB — PROTEIN / CREATININE RATIO, URINE
Creatinine, Urine: 156 mg/dL
Protein Creatinine Ratio: 0.21 mg/mg{Cre} — ABNORMAL HIGH (ref 0.00–0.15)
Total Protein, Urine: 33 mg/dL

## 2021-02-19 NOTE — OB Triage Note (Signed)
° ° °  L&D OB Triage Note  SUBJECTIVE Alexandria Gomez is a 25 y.o. G2P0010 female at [redacted]w[redacted]d, EDD Estimated Date of Delivery: 03/13/21 who presented to triage with complaints of vaginal discharge, headache, floaters,  and decreased fetal movement.    OB History  Gravida Para Term Preterm AB Living  2 0 0 0 1 0  SAB IAB Ectopic Multiple Live Births  1 0 0 0 0    # Outcome Date GA Lbr Len/2nd Weight Sex Delivery Anes PTL Lv  2 Current           1 SAB             Medications Prior to Admission  Medication Sig Dispense Refill Last Dose   Prenatal Vit-Fe Fum-Fe Bisg-FA (NATACHEW) 28-1 MG CHEW CHEW 1 TABLET BY MOUTH DAILY AT 12 NOON. 30 tablet 11 02/18/2021   Prenatal Vit-Fe Fumarate-FA (PRENATAL PLUS VITAMIN/MINERAL) 27-1 MG TABS Take 30 mg by mouth daily. 30 tablet 11      OBJECTIVE  Nursing Evaluation:   BP 128/80 (BP Location: Left Arm)    Pulse (!) 106    Temp 98 F (36.7 C) (Temporal)    Resp 18    LMP 05/26/2020 (Exact Date)    Findings:   Negative for pre eclampsia, Reactive NST with fetal movement noted. Wet Prep negative for infection.      NST was performed and has been reviewed by me.  NST INTERPRETATION: Category I  Mode: External Baseline Rate (A): 145 bpm (fht) Variability: Moderate Accelerations: 15 x 15 Decelerations: None     Contraction Frequency (min): x1  ASSESSMENT Impression:  1.  Pregnancy:  G2P0010 at [redacted]w[redacted]d , EDD Estimated Date of Delivery: 03/13/21 2.  Reassuring fetal and maternal status 3.  Normal vaginal discharge 4, Reactive NST   PLAN 1. Current condition and above findings reviewed.  Reassuring fetal and maternal condition. 2. Discharge home with standard labor precautions given to return to L&D or call the office for problems. 3. Continue routine prenatal care.   Doreene Burke, CNM

## 2021-02-19 NOTE — OB Triage Note (Signed)
Pt Yailin Biederman 25 y.o. presents to labor and delivery triage reporting decreased fetal movement, headache, and vaginal discharge. Pt is a G2P0010 at [redacted]w[redacted]d . Pt reports 6/10 headache on the left side that started today, pt reports blurry vision 'for months and told Pattricia Boss about it", and floaters. Pt  denies RUQ pain. Pt denies signs and symptons consistent with rupture of membranes or active vaginal bleeding, and denies contractions. Pt reports discharge today and describes it as egg white .Serial BP, +2 reflexes, no clonus. External FM and TOCO applied to non-tender abdomen and assessing. Initial FHR 140. Wet prep, CMP, CBC, and P/C urine sent to labs. Provider notified of pt.

## 2021-02-20 LAB — GC/CHLAMYDIA PROBE AMP
Chlamydia trachomatis, NAA: NEGATIVE
Neisseria Gonorrhoeae by PCR: NEGATIVE

## 2021-02-22 ENCOUNTER — Ambulatory Visit
Admission: RE | Admit: 2021-02-22 | Discharge: 2021-02-22 | Disposition: A | Discharge status: Home / Self Care | Payer: Medicaid Other | Source: Ambulatory Visit | Attending: Obstetrics | Admitting: Obstetrics

## 2021-02-22 ENCOUNTER — Other Ambulatory Visit: Payer: Self-pay

## 2021-02-22 DIAGNOSIS — Z3483 Encounter for supervision of other normal pregnancy, third trimester: Secondary | ICD-10-CM | POA: Insufficient documentation

## 2021-02-23 ENCOUNTER — Ambulatory Visit (INDEPENDENT_AMBULATORY_CARE_PROVIDER_SITE_OTHER): Payer: Medicaid Other | Admitting: Obstetrics

## 2021-02-23 ENCOUNTER — Encounter: Payer: Self-pay | Admitting: Obstetrics

## 2021-02-23 DIAGNOSIS — Z3483 Encounter for supervision of other normal pregnancy, third trimester: Secondary | ICD-10-CM

## 2021-02-23 DIAGNOSIS — O163 Unspecified maternal hypertension, third trimester: Secondary | ICD-10-CM

## 2021-02-23 DIAGNOSIS — Z3A37 37 weeks gestation of pregnancy: Secondary | ICD-10-CM

## 2021-02-23 LAB — POCT URINALYSIS DIPSTICK OB
Bilirubin, UA: NEGATIVE
Blood, UA: NEGATIVE
Glucose, UA: NEGATIVE
Ketones, UA: NEGATIVE
Leukocytes, UA: NEGATIVE
Nitrite, UA: NEGATIVE
POC,PROTEIN,UA: NEGATIVE
Spec Grav, UA: 1.015 (ref 1.010–1.025)
Urobilinogen, UA: 0.2 E.U./dL
pH, UA: 6 (ref 5.0–8.0)

## 2021-02-23 NOTE — Progress Notes (Signed)
ROB at [redacted]w[redacted]d. Feeling good fetal movement. Lost her mucus plug. Was advised to go to L&D on 12/25 for decreased fetal movement. She states she was not actually feeling decreased fetal movement (was a miscommunication with the person taking the triage call), but had a RNST and normal labs on L&D. Had growth scan yesterday. Results are not back yet but she states she was told "there is a lot of fluid." Discussed management if polyhydramnios is diagnosed. BP mildly elevated today (recheck 129/90). Has had a headache on the left side that responds to Tylenol. Denies visual symptoms and epigastric pain. Pre-e labs drawn today. Will review [place] report when available and plan management. Weekly NSTs scheduled for elevated BMI. Requested cervical check today FT/25/-2. Reviewed labor s/s and when to call or go to L&D.  Guadlupe Spanish, CNM

## 2021-02-24 LAB — COMPREHENSIVE METABOLIC PANEL
ALT: 7 IU/L (ref 0–32)
AST: 10 IU/L (ref 0–40)
Albumin/Globulin Ratio: 1.5 (ref 1.2–2.2)
Albumin: 3.6 g/dL — ABNORMAL LOW (ref 3.9–5.0)
Alkaline Phosphatase: 186 IU/L — ABNORMAL HIGH (ref 44–121)
BUN/Creatinine Ratio: 14 (ref 9–23)
BUN: 6 mg/dL (ref 6–20)
Bilirubin Total: <0.2 mg/dL (ref 0.0–1.2)
CO2: 20 mmol/L (ref 20–29)
Calcium: 9.1 mg/dL (ref 8.7–10.2)
Chloride: 103 mmol/L (ref 96–106)
Creatinine, Ser: 0.43 mg/dL — ABNORMAL LOW (ref 0.57–1.00)
Globulin, Total: 2.4 g/dL (ref 1.5–4.5)
Glucose: 112 mg/dL — ABNORMAL HIGH (ref 70–99)
Potassium: 4.1 mmol/L (ref 3.5–5.2)
Sodium: 138 mmol/L (ref 134–144)
Total Protein: 6 g/dL (ref 6.0–8.5)
eGFR: 140 mL/min/{1.73_m2} (ref >59)

## 2021-02-24 LAB — CBC
Hematocrit: 38 % (ref 34.0–46.6)
Hemoglobin: 12.7 g/dL (ref 11.1–15.9)
MCH: 29.8 pg (ref 26.6–33.0)
MCHC: 33.4 g/dL (ref 31.5–35.7)
MCV: 89 fL (ref 79–97)
Platelets: 181 10*3/uL (ref 150–450)
RBC: 4.26 x10E6/uL (ref 3.77–5.28)
RDW: 13.1 % (ref 11.7–15.4)
WBC: 8.1 10*3/uL (ref 3.4–10.8)

## 2021-02-24 LAB — PROTEIN / CREATININE RATIO, URINE
Creatinine, Urine: 59.4 mg/dL
Protein, Ur: 10.5 mg/dL
Protein/Creat Ratio: 177 mg/g creat (ref 0–200)

## 2021-02-25 ENCOUNTER — Other Ambulatory Visit: Payer: Self-pay

## 2021-02-25 ENCOUNTER — Encounter: Payer: Self-pay | Admitting: Obstetrics and Gynecology

## 2021-02-25 ENCOUNTER — Inpatient Hospital Stay
Admission: EM | Admit: 2021-02-25 | Discharge: 2021-02-28 | DRG: 807 | Disposition: A | Discharge status: Home / Self Care | Payer: Medicaid Other | Attending: Certified Nurse Midwife | Admitting: Certified Nurse Midwife

## 2021-02-25 DIAGNOSIS — Z20822 Contact with and (suspected) exposure to covid-19: Secondary | ICD-10-CM | POA: Diagnosis present

## 2021-02-25 DIAGNOSIS — O99824 Streptococcus B carrier state complicating childbirth: Secondary | ICD-10-CM | POA: Diagnosis present

## 2021-02-25 DIAGNOSIS — O4292 Full-term premature rupture of membranes, unspecified as to length of time between rupture and onset of labor: Secondary | ICD-10-CM | POA: Diagnosis present

## 2021-02-25 DIAGNOSIS — Z3A37 37 weeks gestation of pregnancy: Secondary | ICD-10-CM | POA: Diagnosis not present

## 2021-02-25 LAB — CBC
HCT: 36 % (ref 36.0–46.0)
Hemoglobin: 12.7 g/dL (ref 12.0–15.0)
MCH: 31 pg (ref 26.0–34.0)
MCHC: 35.3 g/dL (ref 30.0–36.0)
MCV: 87.8 fL (ref 80.0–100.0)
Platelets: 181 10*3/uL (ref 150–400)
RBC: 4.1 MIL/uL (ref 3.87–5.11)
RDW: 13.2 % (ref 11.5–15.5)
WBC: 9.1 10*3/uL (ref 4.0–10.5)
nRBC: 0 % (ref 0.0–0.2)

## 2021-02-25 LAB — COMPREHENSIVE METABOLIC PANEL
ALT: 10 U/L (ref 0–44)
AST: 16 U/L (ref 15–41)
Albumin: 2.9 g/dL — ABNORMAL LOW (ref 3.5–5.0)
Alkaline Phosphatase: 165 U/L — ABNORMAL HIGH (ref 38–126)
Anion gap: 8 (ref 5–15)
BUN: 8 mg/dL (ref 6–20)
CO2: 21 mmol/L — ABNORMAL LOW (ref 22–32)
Calcium: 9.1 mg/dL (ref 8.9–10.3)
Chloride: 106 mmol/L (ref 98–111)
Creatinine, Ser: 0.45 mg/dL (ref 0.44–1.00)
GFR, Estimated: >60 mL/min (ref >60)
Glucose, Bld: 87 mg/dL (ref 70–99)
Potassium: 3.6 mmol/L (ref 3.5–5.1)
Sodium: 135 mmol/L (ref 135–145)
Total Bilirubin: 0.5 mg/dL (ref 0.3–1.2)
Total Protein: 6.7 g/dL (ref 6.5–8.1)

## 2021-02-25 LAB — RUPTURE OF MEMBRANE (ROM)PLUS: Rom Plus: POSITIVE

## 2021-02-25 LAB — RESP PANEL BY RT-PCR (FLU A&B, COVID) ARPGX2
Influenza A by PCR: NEGATIVE
Influenza B by PCR: NEGATIVE
SARS Coronavirus 2 by RT PCR: NEGATIVE

## 2021-02-25 LAB — PROTEIN / CREATININE RATIO, URINE
Creatinine, Urine: 71 mg/dL
Protein Creatinine Ratio: 0.13 mg/mg{Cre} (ref 0.00–0.15)
Total Protein, Urine: 9 mg/dL

## 2021-02-25 LAB — TYPE AND SCREEN
ABO/RH(D): A POS
Antibody Screen: NEGATIVE

## 2021-02-25 MED ORDER — ACETAMINOPHEN 325 MG PO TABS
650.0000 mg | ORAL_TABLET | ORAL | Status: DC | PRN
  Filled 2021-02-25: qty 2

## 2021-02-25 MED ORDER — OXYCODONE-ACETAMINOPHEN 5-325 MG PO TABS: 2.0000 | ORAL_TABLET | ORAL | Status: DC | PRN

## 2021-02-25 MED ORDER — SODIUM CHLORIDE 0.9 % IV SOLN
5.0000 10*6.[IU] | Freq: Once | INTRAVENOUS | Status: AC
  Administered 2021-02-25: 5 10*6.[IU] via INTRAVENOUS
  Filled 2021-02-25: qty 5

## 2021-02-25 MED ORDER — ONDANSETRON HCL 4 MG/2ML IJ SOLN
4.0000 mg | Freq: Four times a day (QID) | INTRAMUSCULAR | Status: DC | PRN
  Administered 2021-02-26: 4 mg via INTRAVENOUS
  Filled 2021-02-25: qty 2

## 2021-02-25 MED ORDER — SOD CITRATE-CITRIC ACID 500-334 MG/5ML PO SOLN: 30.0000 mL | ORAL | Status: DC | PRN

## 2021-02-25 MED ORDER — MISOPROSTOL 50MCG HALF TABLET
50.0000 ug | ORAL_TABLET | ORAL | Status: DC
  Administered 2021-02-26 (×3): 50 ug via VAGINAL
  Filled 2021-02-25 (×4): qty 1

## 2021-02-25 MED ORDER — AMMONIA AROMATIC IN INHA
RESPIRATORY_TRACT | Status: AC
  Filled 2021-02-25: qty 10

## 2021-02-25 MED ORDER — OXYTOCIN BOLUS FROM INFUSION
333.0000 mL | Freq: Once | INTRAVENOUS | Status: AC
  Administered 2021-02-27: 333 mL via INTRAVENOUS

## 2021-02-25 MED ORDER — OXYTOCIN-SODIUM CHLORIDE 30-0.9 UT/500ML-% IV SOLN: 2.5000 [IU]/h | INTRAVENOUS | Status: DC

## 2021-02-25 MED ORDER — OXYTOCIN-SODIUM CHLORIDE 30-0.9 UT/500ML-% IV SOLN
INTRAVENOUS | Status: AC
  Administered 2021-02-26: 1 m[IU]/min via INTRAVENOUS
  Filled 2021-02-25: qty 500

## 2021-02-25 MED ORDER — OXYTOCIN 10 UNIT/ML IJ SOLN
INTRAMUSCULAR | Status: AC
  Filled 2021-02-25: qty 2

## 2021-02-25 MED ORDER — OXYCODONE-ACETAMINOPHEN 5-325 MG PO TABS: 1.0000 | ORAL_TABLET | ORAL | Status: DC | PRN

## 2021-02-25 MED ORDER — MISOPROSTOL 200 MCG PO TABS
ORAL_TABLET | ORAL | Status: AC
  Administered 2021-02-25: 50 ug via VAGINAL
  Filled 2021-02-25: qty 4

## 2021-02-25 MED ORDER — LIDOCAINE HCL (PF) 1 % IJ SOLN
INTRAMUSCULAR | Status: AC
  Administered 2021-02-27: 30 mL via SUBCUTANEOUS
  Filled 2021-02-25: qty 30

## 2021-02-25 MED ORDER — LACTATED RINGERS IV SOLN: INTRAVENOUS | Status: DC

## 2021-02-25 MED ORDER — TERBUTALINE SULFATE 1 MG/ML IJ SOLN: 0.2500 mg | Freq: Once | INTRAMUSCULAR | Status: DC | PRN

## 2021-02-25 MED ORDER — PENICILLIN G POT IN DEXTROSE 60000 UNIT/ML IV SOLN
3.0000 10*6.[IU] | INTRAVENOUS | Status: DC
  Administered 2021-02-26 – 2021-02-27 (×8): 3 10*6.[IU] via INTRAVENOUS
  Filled 2021-02-25 (×8): qty 50

## 2021-02-25 MED ORDER — LIDOCAINE HCL (PF) 1 % IJ SOLN: 30.0000 mL | INTRAMUSCULAR | Status: AC | PRN

## 2021-02-25 MED ORDER — LACTATED RINGERS IV SOLN
500.0000 mL | INTRAVENOUS | Status: DC | PRN
  Administered 2021-02-26 – 2021-02-27 (×4): 500 mL via INTRAVENOUS

## 2021-02-25 NOTE — OB Triage Note (Signed)
Patient reports feeling like her underpants were damp this morning.  She reports wearing a panty liner which has not been soaked but feels damp.  Patient reports baby moving well and occasional braxton hicks.

## 2021-02-26 ENCOUNTER — Inpatient Hospital Stay: Payer: Medicaid Other | Admitting: Anesthesiology

## 2021-02-26 LAB — RPR: RPR Ser Ql: NONREACTIVE

## 2021-02-26 MED ORDER — LACTATED RINGERS IV SOLN
500.0000 mL | Freq: Once | INTRAVENOUS | Status: AC
  Administered 2021-02-26: 500 mL via INTRAVENOUS

## 2021-02-26 MED ORDER — DIPHENHYDRAMINE HCL 50 MG/ML IJ SOLN: 12.5000 mg | INTRAMUSCULAR | Status: DC | PRN

## 2021-02-26 MED ORDER — PHENYLEPHRINE 40 MCG/ML (10ML) SYRINGE FOR IV PUSH (FOR BLOOD PRESSURE SUPPORT): 80.0000 ug | PREFILLED_SYRINGE | INTRAVENOUS | Status: DC | PRN

## 2021-02-26 MED ORDER — EPHEDRINE 5 MG/ML INJ: 10.0000 mg | INTRAVENOUS | Status: DC | PRN

## 2021-02-26 MED ORDER — FENTANYL-BUPIVACAINE-NACL 0.5-0.125-0.9 MG/250ML-% EP SOLN
EPIDURAL | Status: AC
  Filled 2021-02-26: qty 250

## 2021-02-26 MED ORDER — LIDOCAINE-EPINEPHRINE (PF) 1.5 %-1:200000 IJ SOLN
INTRAMUSCULAR | Status: DC | PRN
  Administered 2021-02-26: 3 mL via EPIDURAL

## 2021-02-26 MED ORDER — OXYTOCIN-SODIUM CHLORIDE 30-0.9 UT/500ML-% IV SOLN: 1.0000 m[IU]/min | INTRAVENOUS | Status: DC

## 2021-02-26 MED ORDER — FENTANYL-BUPIVACAINE-NACL 0.5-0.125-0.9 MG/250ML-% EP SOLN
12.0000 mL/h | EPIDURAL | Status: DC | PRN
  Administered 2021-02-26: 12 mL/h via EPIDURAL

## 2021-02-26 MED ORDER — SODIUM CHLORIDE (PF) 0.9 % IJ SOLN
INTRAMUSCULAR | Status: AC
  Filled 2021-02-26: qty 50

## 2021-02-26 MED ORDER — NALBUPHINE HCL 10 MG/ML IJ SOLN
10.0000 mg | INTRAMUSCULAR | Status: DC | PRN
  Administered 2021-02-26: 10 mg via INTRAVENOUS
  Filled 2021-02-26: qty 1

## 2021-02-26 MED ORDER — SODIUM CHLORIDE 0.9 % IV SOLN
INTRAVENOUS | Status: DC | PRN
  Administered 2021-02-26 (×2): 5 mL via EPIDURAL

## 2021-02-26 MED ORDER — LIDOCAINE HCL (PF) 1 % IJ SOLN
INTRAMUSCULAR | Status: DC | PRN
  Administered 2021-02-26: 1 mL via SUBCUTANEOUS

## 2021-02-26 NOTE — Anesthesia Procedure Notes (Signed)
Epidural Patient location during procedure: OB Start time: 02/26/2021 10:51 PM End time: 02/26/2021 11:02 PM  Staffing Anesthesiologist: Iran Ouch, MD Performed: anesthesiologist   Preanesthetic Checklist Completed: patient identified, IV checked, site marked, risks and benefits discussed, surgical consent, monitors and equipment checked, pre-op evaluation and timeout performed  Epidural Patient position: sitting Prep: ChloraPrep Patient monitoring: heart rate, continuous pulse ox and blood pressure Approach: midline Location: L3-L4 Injection technique: LOR saline  Needle:  Needle type: Tuohy  Needle gauge: 18 G Needle length: 9 cm Needle insertion depth: 7 cm Catheter type: closed end Catheter size: 20 Guage Catheter at skin depth: 12 cm Test dose: negative and 1.5% lidocaine with Epi 1:200 K  Assessment Events: blood not aspirated, injection not painful, no injection resistance, no paresthesia and negative IV test  Additional Notes Reason for block:procedure for pain

## 2021-02-26 NOTE — Anesthesia Preprocedure Evaluation (Signed)
Anesthesia Evaluation  Patient identified by MRN, date of birth, ID band Patient awake    Reviewed: Allergy & Precautions, NPO status , Patient's Chart, lab work & pertinent test results  Airway Mallampati: II  TM Distance: >3 FB Neck ROM: Full    Dental no notable dental hx.    Pulmonary neg pulmonary ROS,    Pulmonary exam normal breath sounds clear to auscultation       Cardiovascular negative cardio ROS Normal cardiovascular exam Rhythm:Regular Rate:Normal     Neuro/Psych  Headaches, negative neurological ROS  negative psych ROS   GI/Hepatic negative GI ROS, Neg liver ROS,   Endo/Other  negative endocrine ROS  Renal/GU negative Renal ROS  negative genitourinary   Musculoskeletal negative musculoskeletal ROS (+)   Abdominal   Peds negative pediatric ROS (+)  Hematology negative hematology ROS (+)   Anesthesia Other Findings   Reproductive/Obstetrics negative OB ROS                             Anesthesia Physical Anesthesia Plan  ASA: 2  Anesthesia Plan: Epidural   Post-op Pain Management:    Induction:   PONV Risk Score and Plan:   Airway Management Planned: Natural Airway  Additional Equipment:   Intra-op Plan:   Post-operative Plan:   Informed Consent: I have reviewed the patients History and Physical, chart, labs and discussed the procedure including the risks, benefits and alternatives for the proposed anesthesia with the patient or authorized representative who has indicated his/her understanding and acceptance.       Plan Discussed with:   Anesthesia Plan Comments:         Anesthesia Quick Evaluation

## 2021-02-26 NOTE — Progress Notes (Signed)
LABOR NOTE   Alexandria Gomez 26 y.o.GP@ at [redacted]w[redacted]d  SUBJECTIVE:  Starting to have painful contractions on Pitocin. Feeling more pressure.  Analgesia: Labor support without medications and plans epidural.  OBJECTIVE:  BP 121/77 (BP Location: Left Arm)    Pulse 94    Temp 98.3 F (36.8 C) (Oral)    Resp 18    Ht 5\' 5"  (1.651 m)    Wt 106.1 kg Comment: 234lbs   LMP 05/26/2020 (Exact Date)    BMI 38.94 kg/m  No intake/output data recorded.  SVE:   Dilation: 1 Effacement (%): 50 Station: -3 Exam by::  [email or web address], CNM) Ballotable   CONTRACTIONS: regular, every 2-4 minutes FHR: Fetal heart tracing reviewed. Baseline: 150 Variability: moderate Accelerations: Yes Decelerations:none Category 1  Labs: Lab Results  Component Value Date   WBC 9.1 02/25/2021   HGB 12.7 02/25/2021   HCT 36.0 02/25/2021   MCV 87.8 02/25/2021   PLT 181 02/25/2021    ASSESSMENT: 1)  Labor augmentation     Coping well     Membranes: ROM plus positive, not   leaking fluid          2) Reassuring maternal/fetal status  PLAN:  1) Cook catheter placed 2) Continue Pitocin 3) Activity and upright positions encouraged  4) Nubain for pain relief PRN  02/27/2021, CNM 02/26/2021 6:59 PM

## 2021-02-26 NOTE — Progress Notes (Signed)
LABOR NOTE   Alexandria Gomez 25 y.o.GP@ at [redacted]w[redacted]d  SUBJECTIVE:  Comfortable with epidural  Foley bulb out. Pit paused after a period of fetal tachycardia.  Analgesia: Epidural  OBJECTIVE:  BP 128/71    Pulse 91    Temp 99.4 F (37.4 C) (Oral)    Resp 19    Ht 5\' 5"  (1.651 m)    Wt 106.1 kg Comment: 234lbs   LMP 05/26/2020 (Exact Date)    BMI 38.94 kg/m  No intake/output data recorded.   SVE:   Dilation: 4.5 Effacement (%): 70 Station: -2 Exam by:: Missy, CNM  CONTRACTIONS: regular, every 2-4 min minutes FHR: Fetal heart tracing reviewed. Baseline: 150 Variability: moderate Accelerations:Yes Decelerations:none Category I  Labs: Lab Results  Component Value Date   WBC 9.1 02/25/2021   HGB 12.7 02/25/2021   HCT 36.0 02/25/2021   MCV 87.8 02/25/2021   PLT 181 02/25/2021    ASSESSMENT: Augmentation of labor, progressing well     Coping: Feeling much better with epidural     Membranes: ruptured, clear fluid     Forebag present. Head not well applied.        Principal Problem:   Indication for care in labor or delivery   PLAN: Continue present management Frequent position changes Will restart pitocin in ~1 hour if fetal heart tracing is reassuring Consider AROM when head is well applied  02/27/2021, CNM 02/26/2021 11:26 PM

## 2021-02-26 NOTE — Progress Notes (Signed)
LABOR NOTE   Alexandria Gomez 25 y.o.GP@ at [redacted]w[redacted]d  SUBJECTIVE:  Alexandria Gomez has been able to rest after her last dose of cytotec. She has not been feeling may contractions. She would like to proceed with pitocin, rupture, or Foley bulb placement.  Analgesia: Labor support without medications and plans epidural.  OBJECTIVE:  BP 132/85 (BP Location: Left Arm)    Pulse (!) 105    Temp 98.2 F (36.8 C) (Oral)    Resp 18    Ht 5\' 5"  (1.651 m)    Wt 106.1 kg Comment: 234lbs   LMP 05/26/2020 (Exact Date)    BMI 38.94 kg/m  No intake/output data recorded.  Cervix is unchanged. Fetal head is not well applied to the cervix.  SVE:   Dilation: 1 Effacement (%): 50 Station: -3 Exam by::  [email or web address], CNM)  CONTRACTIONS: irregular, mild FHR: Fetal heart tracing reviewed. Baseline: 150 Variability: moderate Accelerations: Yes Decelerations:none Category I  Labs: Lab Results  Component Value Date   WBC 9.1 02/25/2021   HGB 12.7 02/25/2021   HCT 36.0 02/25/2021   MCV 87.8 02/25/2021   PLT 181 02/25/2021    ASSESSMENT: 1)  Labor augmentation, cervix unchanged     Coping well.     Membranes: Positive ROM plus, not currently leaking fluid.           2) Reassuring maternal/fetal status  Principal Problem:   Indication for care in labor or delivery   PLAN: Discussed options. Will proceed with low dose Pitocin at this time. Alexandria Gomez and her family are in agreement with this plan.  Reviewed with Dr. Sebastian Ache.   Alexandria Gomez, CNM 02/26/2021 4:57 PM

## 2021-02-26 NOTE — H&P (Signed)
History and Physical   HPI  Alexandria Gomez is a 26 y.o. G2P0010 at [redacted]w[redacted]d Estimated Date of Delivery: 03/13/21 who came in yesterday with c/o leaking fluid.  ROM + was positive.  Induction for PROM with Misoprostol begun.   Pt now contracting irregularly but contractions not strong.  OB History  OB History  Gravida Para Term Preterm AB Living  2 0 0 0 1 0  SAB IAB Ectopic Multiple Live Births  1 0 0 0 0    # Outcome Date GA Lbr Len/2nd Weight Sex Delivery Anes PTL Lv  2 Current           1 SAB             PROBLEM LIST  Pregnancy complications or risks: Patient Active Problem List   Diagnosis Date Noted   Indication for care in labor or delivery 02/25/2021   Labor and delivery, indication for care 11/27/2020   Heart rate fast 03/11/2020   PCOS (polycystic ovarian syndrome) 03/11/2020   Breast pain 03/11/2020   Migraine with aura 03/11/2020    Prenatal labs and studies: ABO, Rh: --/--/A POS (12/31 1938) Antibody: NEG (12/31 1938) Rubella: 7.23 (06/22 1538) RPR: Non Reactive (10/26 1009)  HBsAg: Negative (06/22 1538)  HIV: Non Reactive (06/22 1538)  GBS:    Past Medical History:  Diagnosis Date   Migraines    PCOS (polycystic ovarian syndrome)      Past Surgical History:  Procedure Laterality Date   wisdom teeth removal       Medications    Current Discharge Medication List     CONTINUE these medications which have NOT CHANGED   Details  Prenatal Vit-Fe Fum-Fe Bisg-FA (NATACHEW) 28-1 MG CHEW CHEW 1 TABLET BY MOUTH DAILY AT 12 NOON. Qty: 30 tablet, Refills: 11    Prenatal Vit-Fe Fumarate-FA (PRENATAL PLUS VITAMIN/MINERAL) 27-1 MG TABS Take 30 mg by mouth daily. Qty: 30 tablet, Refills: 11   Associated Diagnoses: Pregnancy with inconclusive fetal viability, single or unspecified fetus         Allergies  Patient has no known allergies.  Review of Systems  Pertinent items noted in HPI and remainder of comprehensive ROS otherwise  negative.  Physical Exam  BP (!) 132/92    Pulse 98    Temp 98 F (36.7 C) (Oral)    Resp 17    Ht 5\' 5"  (1.651 m)    Wt 106.1 kg Comment: 234lbs   LMP 05/26/2020 (Exact Date)    BMI 38.94 kg/m   Lungs:  CTA B Cardio: RRR without M/R/G Abd: Soft, gravid, NT Presentation: cephalic EXT: No C/C/ 1+ Edema DTRs: 2+ B CERVIX: Dilation: 1.5 Effacement (%): 30 Cervical Position: Middle Station: Ballotable Presentation: Vertex Exam by:: Shivangi Lutz  See Prenatal records for more detailed PE.     FHR:  Accelerations: Reactive  Toco: Uterine Contractions: Irregular mild  Test Results  Results for orders placed or performed during the hospital encounter of 02/25/21 (from the past 24 hour(s))  ROM Plus ([facility] only)     Status: None   Collection Time: 02/25/21  4:33 PM  Result Value Ref Range   Rom Plus POSITIVE   Resp Panel by RT-PCR (Flu A&B, Covid) Nasopharyngeal Swab     Status: None   Collection Time: 02/25/21  5:49 PM   Specimen: Nasopharyngeal Swab; Nasopharyngeal(NP) swabs in vial transport medium  Result Value Ref Range   SARS Coronavirus 2 by RT PCR NEGATIVE  NEGATIVE   Influenza A by PCR NEGATIVE NEGATIVE   Influenza B by PCR NEGATIVE NEGATIVE  CBC     Status: None   Collection Time: 02/25/21  7:38 PM  Result Value Ref Range   WBC 9.1 4.0 - 10.5 K/uL   RBC 4.10 3.87 - 5.11 MIL/uL   Hemoglobin 12.7 12.0 - 15.0 g/dL   HCT 29.5 28.4 - 13.2 %   MCV 87.8 80.0 - 100.0 fL   MCH 31.0 26.0 - 34.0 pg   MCHC 35.3 30.0 - 36.0 g/dL   RDW 44.0 10.2 - 72.5 %   Platelets 181 150 - 400 K/uL   nRBC 0.0 0.0 - 0.2 %  Type and screen [facility]     Status: None   Collection Time: 02/25/21  7:38 PM  Result Value Ref Range   ABO/RH(D) A POS    Antibody Screen NEG    Sample Expiration      02/28/2021,2359 Performed at [facility], [address] [place], [place] [address]   Comprehensive metabolic panel     Status: Abnormal   Collection Time:  02/25/21  7:38 PM  Result Value Ref Range   Sodium 135 135 - 145 mmol/L   Potassium 3.6 3.5 - 5.1 mmol/L   Chloride 106 98 - 111 mmol/L   CO2 21 (L) 22 - 32 mmol/L   Glucose, Bld 87 70 - 99 mg/dL   BUN 8 6 - 20 mg/dL   Creatinine, Ser 0.34 0.44 - 1.00 mg/dL   Calcium 9.1 8.9 - 74.2 mg/dL   Total Protein 6.7 6.5 - 8.1 g/dL   Albumin 2.9 (L) 3.5 - 5.0 g/dL   AST 16 15 - 41 U/L   ALT 10 0 - 44 U/L   Alkaline Phosphatase 165 (H) 38 - 126 U/L   Total Bilirubin 0.5 0.3 - 1.2 mg/dL   GFR, Estimated >59 >56 mL/min   Anion gap 8 5 - 15  Protein / creatinine ratio, urine     Status: None   Collection Time: 02/25/21  7:38 PM  Result Value Ref Range   Creatinine, Urine 71 mg/dL   Total Protein, Urine 9 mg/dL   Protein Creatinine Ratio 0.13 0.00 - 0.15 mg/mg[Cre]   Group B Strep positive  Assessment   G2P0010 at 105w6d Estimated Date of Delivery: 03/13/21  The fetus is reassuring.   Patient Active Problem List   Diagnosis Date Noted   Indication for care in labor or delivery 02/25/2021   Labor and delivery, indication for care 11/27/2020   Heart rate fast 03/11/2020   PCOS (polycystic ovarian syndrome) 03/11/2020   Breast pain 03/11/2020   Migraine with aura 03/11/2020    Plan  1. Admit to L&D :   2. EFM: -- Category 1 3. Stadol or Epidural if desired.   4. Admission labs  5. Continue with Misoprostol 6. AROM when possible (mild poly dxed by [place]) - pt not apparently leaking at this time.  Elonda Husky, M.D. 02/26/2021 8:29 AM

## 2021-02-26 NOTE — L&D Delivery Note (Addendum)
°     Delivery Note   Alexandria Gomez is a 26 y.o. G2P0010 at [redacted]w[redacted]d Estimated Date of Delivery: 03/13/21 who presented for rupture of membranes.   PRE-OPERATIVE DIAGNOSIS:  1) [redacted]w[redacted]d pregnancy.    POST-OPERATIVE DIAGNOSIS:  1) [redacted]w[redacted]d pregnancy s/p Vaginal, Spontaneous    Delivery Type: Vaginal, Spontaneous    Delivery Anesthesia: Epidural;Local   Labor Complications:  Prolonged second stage    ESTIMATED BLOOD LOSS: 550  ml    FINDINGS:   1) female infant, Apgar scores of 8 at 1 minute and 9 at 5 minutes. Birthweight pending.   SPECIMENS:   PLACENTA:   Appearance: Intact    Removal: Spontaneous      Disposition:  Per protocol  CORD BLOOD: Not Indicated  DISPOSITION:  Infant to left in stable condition in the delivery room, with L&D personnel and mother,  NARRATIVE SUMMARY: Labor course:  Alexandria Gomez is a G2P0010 at [redacted]w[redacted]d who presented to Labor & Delivery for induction of labor, PROM. Her initial cervical exam was 1/30/-3. Labor proceeded with augmentation and she was found to be completely dilated at 0325. With excellent maternal pushing effort, she birthed a viable female infant at 67. A tight nuchal cord was noted, and the infant was somersaulted through.The shoulders were birthed without difficulty. The infant was placed skin-to-skin with his mother. The cord was doubly clamped and cut when pulsations ceased. The placenta delivered spontaneously and was noted to be intact with a 3VC. Brisk uterine bleeding was noted after the placenta. Fundus was massaged vigorously and methergine was given, after which bleeding was minimal. A perineal and vaginal examination was performed. Lacerations: 2nd degree;Perineal  Lacerations were repaired with Vicryl rapide suture using local anesthesia. The patient tolerated this well. Mother and baby were left in stable condition.  This birth was proctored by Brennan Bailey, MD  Alexandria Gomez, CNM 02/27/2021 8:10 AM

## 2021-02-26 NOTE — Progress Notes (Signed)
LABOR NOTE   Alexandria Gomez 25 y.o.GP@ at [redacted]w[redacted]d  SUBJECTIVE:  Alexandria Gomez has been feeling mild contractions. She has been moving around and up on the birth ball. She reports a small amount of bloody show.   Analgesia: Labor support without medications and plan epidural later  OBJECTIVE:  BP 119/64 (BP Location: Right Arm)    Pulse 100    Temp 98.3 F (36.8 C) (Oral)    Resp 18    Ht 5\' 5"  (1.651 m)    Wt 106.1 kg Comment: 234lbs   LMP 05/26/2020 (Exact Date)    BMI 38.94 kg/m  No intake/output data recorded.  SVE:   Dilation: 1 Effacement (%): 50 Station: -3 Exam by:: [email or web address], CNM CONTRACTIONS: regular, every 2-3 minutes x 60 sec, mild FHR: Fetal heart tracing reviewed. Baseline: 150 Variability: moderate Accelerations: occasional Decelerations: none Category 1  Labs: Lab Results  Component Value Date   WBC 9.1 02/25/2021   HGB 12.7 02/25/2021   HCT 36.0 02/25/2021   MCV 87.8 02/25/2021   PLT 181 02/25/2021    ASSESSMENT: 1)  Augmentation of labor; latent phase     Coping well. Good support     Membranes: ruptured per ROM +, no fluid  noted on pad            Principal Problem:   Indication for care in labor or delivery   PLAN: 1) Membranes swept. Cytotec placed. 2) Discussed plan of care, possible rupture of forebag when head is well-applied to the cervix.  3) Encouraged resting and eating lunch, and then movement and upright positions. 4) Re-evaluate in 4 hours. Consider foley bulb or Pitocin augmentation.   02/27/2021, CNM 02/26/2021 12:41 PM

## 2021-02-27 ENCOUNTER — Encounter: Payer: Self-pay | Admitting: Obstetrics and Gynecology

## 2021-02-27 MED ORDER — SIMETHICONE 80 MG PO CHEW
80.0000 mg | CHEWABLE_TABLET | ORAL | Status: DC | PRN
  Administered 2021-02-27: 80 mg via ORAL
  Filled 2021-02-27: qty 1

## 2021-02-27 MED ORDER — METHYLERGONOVINE MALEATE 0.2 MG/ML IJ SOLN: 0.2000 mg | Freq: Once | INTRAMUSCULAR | Status: AC

## 2021-02-27 MED ORDER — OXYCODONE-ACETAMINOPHEN 5-325 MG PO TABS: 1.0000 | ORAL_TABLET | ORAL | Status: DC | PRN

## 2021-02-27 MED ORDER — OXYTOCIN-SODIUM CHLORIDE 30-0.9 UT/500ML-% IV SOLN
2.5000 [IU]/h | INTRAVENOUS | Status: DC | PRN
  Administered 2021-02-27: 2.5 [IU]/h via INTRAVENOUS
  Filled 2021-02-27: qty 500

## 2021-02-27 MED ORDER — IBUPROFEN 600 MG PO TABS
600.0000 mg | ORAL_TABLET | Freq: Four times a day (QID) | ORAL | Status: DC
  Administered 2021-02-27 – 2021-02-28 (×5): 600 mg via ORAL
  Filled 2021-02-27 (×6): qty 1

## 2021-02-27 MED ORDER — DOCUSATE SODIUM 100 MG PO CAPS
100.0000 mg | ORAL_CAPSULE | Freq: Two times a day (BID) | ORAL | Status: DC
  Administered 2021-02-28: 100 mg via ORAL
  Filled 2021-02-27 (×2): qty 1

## 2021-02-27 MED ORDER — METHYLERGONOVINE MALEATE 0.2 MG PO TABS
0.2000 mg | ORAL_TABLET | ORAL | Status: DC | PRN
  Filled 2021-02-27: qty 1

## 2021-02-27 MED ORDER — ACETAMINOPHEN 325 MG PO TABS
650.0000 mg | ORAL_TABLET | ORAL | Status: DC | PRN
  Administered 2021-02-27: 650 mg via ORAL

## 2021-02-27 MED ORDER — METHYLERGONOVINE MALEATE 0.2 MG/ML IJ SOLN
0.2000 mg | INTRAMUSCULAR | Status: DC | PRN
  Filled 2021-02-27: qty 1

## 2021-02-27 MED ORDER — TETANUS-DIPHTH-ACELL PERTUSSIS 5-2.5-18.5 LF-MCG/0.5 IM SUSY
0.5000 mL | PREFILLED_SYRINGE | Freq: Once | INTRAMUSCULAR | Status: DC
  Filled 2021-02-27: qty 0.5

## 2021-02-27 MED ORDER — DIPHENHYDRAMINE HCL 25 MG PO CAPS: 25.0000 mg | ORAL_CAPSULE | Freq: Four times a day (QID) | ORAL | Status: DC | PRN

## 2021-02-27 MED ORDER — METHYLERGONOVINE MALEATE 0.2 MG/ML IJ SOLN
INTRAMUSCULAR | Status: AC
  Administered 2021-02-27: 0.2 mg via INTRAMUSCULAR
  Filled 2021-02-27: qty 1

## 2021-02-27 MED ORDER — PRENATAL MULTIVITAMIN CH
1.0000 | ORAL_TABLET | Freq: Every day | ORAL | Status: DC
  Administered 2021-02-27: 1 via ORAL
  Filled 2021-02-27 (×2): qty 1

## 2021-02-27 MED ORDER — BENZOCAINE-MENTHOL 20-0.5 % EX AERO
1.0000 "application " | INHALATION_SPRAY | CUTANEOUS | Status: DC | PRN
  Administered 2021-02-27 (×2): 1 via TOPICAL
  Filled 2021-02-27 (×2): qty 56

## 2021-02-27 NOTE — Progress Notes (Signed)
LABOR NOTE   Alexandria Gomez 25 y.Z.[id number]@ at [redacted]w[redacted]d  SUBJECTIVE:  Coping well with epidural.  Analgesia: Epidural  OBJECTIVE:  BP 115/62    Pulse (!) 111    Temp 99.1 F (37.3 C) (Oral)    Resp 17    Ht 5\' 5"  (1.651 m)    Wt 106.1 kg Comment: 234lbs   LMP 05/26/2020 (Exact Date)    SpO2 98%    BMI 38.94 kg/m  No intake/output data recorded.  CERVIX:  SVE:   Dilation: 7 Effacement (%): 80 Station: -2 Exam by:: [email or web address], CNM CONTRACTIONS: regular, every 2-3 minutes FHR: Fetal heart tracing reviewed. Baseline: 145 Variability: moderate Accelerations: present Decelerations:early Category 2  Labs: Lab Results  Component Value Date   WBC 9.1 02/25/2021   HGB 12.7 02/25/2021   HCT 36.0 02/25/2021   MCV 87.8 02/25/2021   PLT 181 02/25/2021    ASSESSMENT: 1) Augmentation of labor, progressing well       Membranes: spontaneous rupture with exam. Clear fluid.     MD at bedside.       Principal Problem:   Indication for care in labor or delivery   PLAN: Continue present management Anticipate NSVD  02/27/2021, CNM 02/27/2021 2:41 AM

## 2021-02-27 NOTE — Discharge Instructions (Signed)

## 2021-02-27 NOTE — Lactation Note (Signed)
This note was copied from a baby's chart. Lactation Consultation Note  Patient Name: Alexandria Gomez [id number] Date: 02/27/2021 Reason for consult: Initial assessment;Primapara;Early term 37-38.6wks Age:24 hours  Initial lactation visit. Mom is P1, SVD <10hrs ago. First feeding documented 1 hour after birth for 20 minutes. Several attempts made thus far with no feedings. Mom reports that she unwraps baby and baby will latch and suckle and go right back to sleep.  LC watched as mom removed blankets, brought baby into cradle hold. At this attempt baby did not even open mouth, remained asleep. LC demonstrated how to hand express some colostrum to help encourage him; baby remained sleepy and uninterested- no output at this time. We reviewed newborn behaviors and feeding patterns, encouraged attempts every 2-3 hours, spending time skin to skin as much as possible. We reviewed output expectations, strategies of hand expression and spoon feeding if needed to ensure intake. Mom verbalized understanding and plans to attempt again in the next hour or so.  Maternal Data Has patient been taught Hand Expression?: Yes Does the patient have breastfeeding experience prior to this delivery?: No  Feeding Mother's Current Feeding Choice: Breast Milk  LATCH Score Latch: Too sleepy or reluctant, no latch achieved, no sucking elicited.                  Lactation Tools Discussed/Used    Interventions Interventions: Breast feeding basics reviewed;Skin to skin;Hand express;Support pillows;Education  Discharge    Consult Status Consult Status: Follow-up Date: 02/28/21 Follow-up type: In-patient    Danford Bad 02/27/2021, 5:15 PM

## 2021-02-27 NOTE — Progress Notes (Signed)
LABOR NOTE   Tavin Flom 26 y.o.GP@ at [redacted]w[redacted]d  SUBJECTIVE:  Alexandria Gomez has been pushing for 2 hours. Feeling exhausted. Progressed from -2 to 0 station with good maternal effort. She had a period of fetal tachycardia to the 170s and has made minimal progress over the last hour of pushing. Dr. Amalia Hailey consulted.  Analgesia: Epidural  OBJECTIVE:  BP 115/62    Pulse (!) 111    Temp 98.6 F (37 C) (Oral)    Resp 17    Ht 5\' 5"  (1.651 m)    Wt 106.1 kg Comment: 234lbs   LMP 05/26/2020 (Exact Date)    SpO2 98%    BMI 38.94 kg/m  No intake/output data recorded.    SVE:   C/C/0 CONTRACTIONS: regular, every 2-3 minutes FHR: Fetal heart tracing reviewed. Baseline:  Variability: moderate Accelerations: present Decelerations:none Category 1  Labs: Lab Results  Component Value Date   WBC 9.1 02/25/2021   HGB 12.7 02/25/2021   HCT 36.0 02/25/2021   MCV 87.8 02/25/2021   PLT 181 02/25/2021    ASSESSMENT: 1)  Augmentation of labor. Complete and pushing     Coping: Feeling exhausted but coping well     Membranes: ruptured, clear fluid             PLAN: After consultation with Dr. Amalia Hailey, will increase Pitocin and decrease epidural to improve pushing efforts. Anticipate NSVD.  Lloyd Huger, CNM 02/27/2021 5:47 AM

## 2021-02-28 MED ORDER — IBUPROFEN 600 MG PO TABS: 600.0000 mg | ORAL_TABLET | Freq: Four times a day (QID) | ORAL | 0 refills | Status: DC

## 2021-02-28 NOTE — Discharge Summary (Signed)
Patient Name: Alexandria Gomez DOB: [birth year] MRN: [id number]                            Discharge Summary  Date of Admission: 02/25/2021 Date of Discharge: 02/28/2021 Delivering Provider: Lurlean Horns   Admitting Diagnosis: Indication for care in labor or delivery [O75.9] at [redacted]w[redacted]d Secondary diagnosis:  Principal Problem:   Indication for care in labor or delivery   Mode of Delivery: normal spontaneous vaginal delivery              Discharge diagnosis: Term Pregnancy Delivered      Intrapartum Procedures: epidural and laceration 2nd perineal   Post partum procedures:  none  Complications:  2nd degree perineal laceration                     Discharge Day SOAP Note:  Progress Note - Vaginal Delivery  Alexandria Gomez is a 26 y.o. G2P1011 now PP day 1 s/p Vaginal, Spontaneous . Delivery was uncomplicated  Subjective  The patient has the following complaints: has no unusual complaints  Pain is controlled with current medications.   Patient is urinating without difficulty.  She is ambulating well.     Objective  Vital signs: BP 106/60 (BP Location: Left Arm)    Pulse (!) 109    Temp 97.9 F (36.6 C) (Oral)    Resp 20    Ht 5\' 5"  (1.651 m)    Wt 106.1 kg Comment: 234lbs   LMP 05/26/2020 (Exact Date)    SpO2 99%    Breastfeeding Unknown    BMI 38.94 kg/m   Physical Exam: Gen: NAD Fundus Fundal Tone: Firm  Lochia Amount: Scant        Data Review Labs: Lab Results  Component Value Date   WBC 9.1 02/25/2021   HGB 12.7 02/25/2021   HCT 36.0 02/25/2021   MCV 87.8 02/25/2021   PLT 181 02/25/2021   CBC Latest Ref Rng & Units 02/25/2021 02/23/2021 02/19/2021  WBC 4.0 - 10.5 K/uL 9.1 8.1 8.5  Hemoglobin 12.0 - 15.0 g/dL 12.7 12.7 12.1  Hematocrit 36.0 - 46.0 % 36.0 38.0 35.1(L)  Platelets 150 - 400 K/uL 181 181 184   A POS  Edinburgh Score: Edinburgh Postnatal Depression Scale Screening Tool 02/27/2021  I have been able to laugh and see the  funny side of things. 0  I have looked forward with enjoyment to things. 0  I have blamed myself unnecessarily when things went wrong. 0  I have been anxious or worried for no good reason. 0  I have felt scared or panicky for no good reason. 3  Things have been getting on top of me. 1  I have been so unhappy that I have had difficulty sleeping. 0  I have felt sad or miserable. 1  I have been so unhappy that I have been crying. 0  The thought of harming myself has occurred to me. 0  Edinburgh Postnatal Depression Scale Total 5    Assessment/Plan  Principal Problem:   Indication for care in labor or delivery    Plan for discharge today.  Discharge Instructions: Per After Visit Summary. Activity: Advance as tolerated. Pelvic rest for 6 weeks.  Also refer to After Visit Summary Diet: Regular Medications: Allergies as of 02/28/2021   No Known Allergies      Medication List     TAKE these  medications    ibuprofen 600 MG tablet Commonly known as: ADVIL Take 1 tablet (600 mg total) by mouth every 6 (six) hours.   NataChew 28-1 MG Chew CHEW 1 TABLET BY MOUTH DAILY AT 12 NOON.   Prenatal Plus Vitamin/Mineral 27-1 MG Tabs Take 30 mg by mouth daily.       Outpatient follow up: 2 weeks & 6 wks postpartum with Missy Swanson CNM  Postpartum contraception: plans nexplanon Discharged Condition: good  Discharged to: home  Newborn Data: Disposition:home with mother  Apgars: APGAR (1 MIN): 8   APGAR (5 MINS): 9   APGAR (10 MINS):    Baby Feeding: Breast    Philip Aspen, CNM  02/28/2021 7:22 AM

## 2021-02-28 NOTE — Lactation Note (Signed)
This note was copied from a baby's chart. Lactation Consultation Note  Patient Name: Alexandria Gomez [id number] Date: 02/28/2021 Reason for consult: Follow-up assessment;Primapara;Early term 37-38.6wks Age:24 hours  Lactation called to bedside to assist with feeding due to longer period since last feeding and parents desiring discharge. Baby was asleep in bassinet upon arrival, swaddled with clothing.  Baby received a circumcision at 0835 this morning and may be contributing to the increased sleepiness. LC unswaddled and removed clothing from baby, baby woke easily and began crying with more rigid tone/balled fists. Baby brought to mom and mom independent with positioning for football hold, slight adjustment made by LC. Several attempts made but baby was not opening mouth. LC did offer gloved finger and baby had automatic rhythmic sucking pattern. Mom has bilateral flat nipples and brought her own size 54mm NS. Parents educated on how to place a nipple shield, reason for nipple shield, and cleaning. LC gently pulled down on chin as mom brought baby in; baby accepted the shield and breast well with a rhythmic suck pattern with breaks on/off. Tips given for keeping baby stimulated at the breast throughout feeding. Reviewed feeding plan at home: -Feedings every 2-3 hours, possible ongoing need for nipple shield, but trying to latch at first without it. -Recommended to pump post BF to ensure adequate emptying (NS is a barrier), and help boost supply. -Skin to skin throughout the day/night when possible to encourage bonding and support breastfeeding goals. -Review of output expectations, and when to be concerned and to contact MD; follow-up appointment tomorrow.  Outpatient lactation information given. Encouraged to call for any questions, any concerns, and for ongoing BF support.  Maternal Data Has patient been taught Hand Expression?: Yes Does the patient have breastfeeding experience prior to  this delivery?: No  Feeding Mother's Current Feeding Choice: Breast Milk  LATCH Score Latch: Repeated attempts needed to sustain latch, nipple held in mouth throughout feeding, stimulation needed to elicit sucking reflex.  Audible Swallowing: A few with stimulation  Type of Nipple: Flat  Comfort (Breast/Nipple): Soft / non-tender  Hold (Positioning): No assistance needed to correctly position infant at breast.  LATCH Score: 7   Lactation Tools Discussed/Used Tools: Nipple Shields Nipple shield size: 20  Interventions Interventions: Breast feeding basics reviewed;Assisted with latch;Hand express;Adjust position;Support pillows;Education;DEBP (nipple shield)  Discharge Discharge Education: Engorgement and breast care;Warning signs for feeding baby;Outpatient recommendation Pump: Personal  Consult Status Consult Status: Complete Date: 02/28/21 Follow-up type: Call as needed    Danford Bad 02/28/2021, 1:18 PM

## 2021-02-28 NOTE — Anesthesia Postprocedure Evaluation (Signed)
Anesthesia Post Note  Patient: Alexandria Gomez  Procedure(s) Performed: AN AD HOC LABOR EPIDURAL  Patient location during evaluation: Mother Baby Anesthesia Type: Epidural Level of consciousness: oriented and awake and alert Pain management: pain level controlled Vital Signs Assessment: post-procedure vital signs reviewed and stable Respiratory status: spontaneous breathing and respiratory function stable Cardiovascular status: blood pressure returned to baseline and stable Postop Assessment: no headache, no backache, no apparent nausea or vomiting and able to ambulate Anesthetic complications: no   No notable events documented.   Last Vitals:  Vitals:   02/27/21 2031 02/28/21 0022  BP: 115/67 106/60  Pulse: (!) 106 (!) 109  Resp: 20 20  Temp: 36.7 C 36.6 C  SpO2: 100% 99%    Last Pain:  Vitals:   02/28/21 0745  TempSrc:   PainSc: 3                  Starling Manns

## 2021-02-28 NOTE — Progress Notes (Signed)
Discharge instructions, prescriptions, education, and appointments given and explained. Pt verbalized understanding with no further questions. Pt wheeled to personal vehicle with all belongings, S/O and infant for d/c home.

## 2021-02-28 NOTE — Final Progress Note (Signed)
Discharge Day SOAP Note:  Progress Note - Vaginal Delivery  Alexandria Gomez is a 26 y.o. [id number] now PP day 1 s/p Vaginal, Spontaneous . Delivery was uncomplicated  Subjective  The patient has the following complaints: has no unusual complaints  Pain is controlled with current medications.   Patient is urinating without difficulty.  She is ambulating well.     Objective  Vital signs: BP 106/60 (BP Location: Left Arm)    Pulse (!) 109    Temp 97.9 F (36.6 C) (Oral)    Resp 20    Ht 5\' 5"  (1.651 m)    Wt 106.1 kg Comment: 234lbs   LMP 05/26/2020 (Exact Date)    SpO2 99%    Breastfeeding Unknown    BMI 38.94 kg/m   Physical Exam: Gen: NAD Fundus Fundal Tone: Firm  Lochia Amount: Scant        Data Review Labs: Lab Results  Component Value Date   WBC 9.1 02/25/2021   HGB 12.7 02/25/2021   HCT 36.0 02/25/2021   MCV 87.8 02/25/2021   PLT 181 02/25/2021   CBC Latest Ref Rng & Units 02/25/2021 02/23/2021 02/19/2021  WBC 4.0 - 10.5 K/uL 9.1 8.1 8.5  Hemoglobin 12.0 - 15.0 g/dL 12.7 12.7 12.1  Hematocrit 36.0 - 46.0 % 36.0 38.0 35.1(L)  Platelets 150 - 400 K/uL 181 181 184   A POS  Edinburgh Score: Edinburgh Postnatal Depression Scale Screening Tool 02/27/2021  I have been able to laugh and see the funny side of things. 0  I have looked forward with enjoyment to things. 0  I have blamed myself unnecessarily when things went wrong. 0  I have been anxious or worried for no good reason. 0  I have felt scared or panicky for no good reason. 3  Things have been getting on top of me. 1  I have been so unhappy that I have had difficulty sleeping. 0  I have felt sad or miserable. 1  I have been so unhappy that I have been crying. 0  The thought of harming myself has occurred to me. 0  Edinburgh Postnatal Depression Scale Total 5    Assessment/Plan  Principal Problem:   Indication for care in labor or delivery    Plan for discharge today.  Discharge  Instructions: Per After Visit Summary. Activity: Advance as tolerated. Pelvic rest for 6 weeks.  Also refer to After Visit Summary Diet: Regular Medications: Allergies as of 02/28/2021   No Known Allergies      Medication List     TAKE these medications    ibuprofen 600 MG tablet Commonly known as: ADVIL Take 1 tablet (600 mg total) by mouth every 6 (six) hours.   NataChew 28-1 MG Chew CHEW 1 TABLET BY MOUTH DAILY AT 12 NOON.   Prenatal Plus Vitamin/Mineral 27-1 MG Tabs Take 30 mg by mouth daily.       Outpatient follow up: 2 weeks & 6 wks postpartum with Missy Swanson CNM  Postpartum contraception: plans nexplanon Discharged Condition: good  Discharged to: home  Newborn Data: Disposition:home with mother  Apgars: APGAR (1 MIN): 8   APGAR (5 MINS): 9   APGAR (10 MINS):    Baby Feeding: Breast    Philip Aspen, CNM

## 2021-02-28 NOTE — Lactation Note (Signed)
This note was copied from a baby's chart. Lactation Consultation Note  Patient Name: Alexandria Gomez [id number] Date: 02/28/2021 Reason for consult: Follow-up assessment;Primapara;Early term 37-38.6wks Age:24 hours  Lactation follow-up. Baby remained overall sleepy for most of the night with just a couple of documented feedings with RN support. Early this morning baby had large emesis, hopes are that baby will be more interested in feeding, however did also receive a circumcision this morning. RN assisted with positioning and support for attempted feeding when baby returned. Baby at the breast upon entry. Attempts made for feeding but baby sleeping. [facility] student reviewed hand expression as a tip to help encourage a feeding, and options for spoon feeding if needed. LC reviewed with mom importance of skin to skin, early hunger cues, feeding on demand, and milk supply and demand. Tips given for how to stimulate baby for feedings and how to keep baby awake for full feedings. Encouraged ongoing frequent attempts throughout today.  Maternal Data Has patient been taught Hand Expression?: Yes Does the patient have breastfeeding experience prior to this delivery?: No  Feeding Mother's Current Feeding Choice: Breast Milk  LATCH Score Latch: Too sleepy or reluctant, no latch achieved, no sucking elicited. (just returned from circumcision)  Audible Swallowing: None  Type of Nipple: Flat  Comfort (Breast/Nipple): Soft / non-tender  Hold (Positioning): Assistance needed to correctly position infant at breast and maintain latch.  LATCH Score: 4   Lactation Tools Discussed/Used    Interventions Interventions: Breast feeding basics reviewed;Hand express;Support pillows;Education  Discharge    Consult Status Consult Status: Follow-up Date: 02/28/21 Follow-up type: In-patient    Lavonia Drafts 02/28/2021, 9:53 AM

## 2021-03-01 ENCOUNTER — Ambulatory Visit: Payer: Medicaid Other

## 2021-03-09 ENCOUNTER — Encounter: Payer: Medicaid Other | Admitting: Obstetrics

## 2021-03-10 ENCOUNTER — Telehealth (INDEPENDENT_AMBULATORY_CARE_PROVIDER_SITE_OTHER): Payer: Medicaid Other | Admitting: Obstetrics

## 2021-03-10 ENCOUNTER — Encounter: Payer: Self-pay | Admitting: Obstetrics

## 2021-03-10 NOTE — Progress Notes (Signed)
Two Week PP Visit via Video Note  I connected with Alexandria Gomez on 03/10/21 at  3:30 PM EST by a video enabled telemedicine application and verified that I am speaking with the correct person using two identifiers.  Location: Patient: Home Provider: Office   I discussed the limitations of evaluation and management by telemedicine and the availability of in person appointments. The patient expressed understanding and agreed to proceed.  History of Present Illness: Alexandria Gomez is a 26 y.o.G2P1011 s/p NSVD on 02/27/2021. She gave birth to a female infant, Alexandria Gomez, who weighed 6 lbs., 10.5 oz. She had a second degree perineal laceration. She is breastfeeding during the day and formula feeding at night.  Observations/Objective: Alexandria Gomez is tired but feels well overall. Her mood is good (EPDS=1). She reports good support from her husband. Breastfeeding is going well, and she is able to pump about 6 oz of breast milk per session. She reports minimal bleeding. She has some perineal soreness but no pain or s/s of infection. She has no concerns about her bowel and bladder function.   Assessment and Plan: Normal healing at 2 weeks PP No breastfeeding concerns PPD screen negative  Follow Up Instructions: Office visit 04/07/21 for PP follow up  Call with questions or concerns  I discussed the assessment and treatment plan with the patient. The patient was provided an opportunity to ask questions and all were answered. The patient agreed with the plan and demonstrated an understanding of the instructions.   The patient was advised to call back or seek an in-person evaluation if the symptoms worsen or if the condition fails to improve as anticipated.  I provided 8 minutes of non-face-to-face time during this encounter.   Glenetta Borg, CNM

## 2021-03-13 ENCOUNTER — Inpatient Hospital Stay: Admit: 2021-03-13 | Payer: Self-pay

## 2021-03-15 ENCOUNTER — Other Ambulatory Visit: Payer: Self-pay

## 2021-03-15 ENCOUNTER — Emergency Department (HOSPITAL_COMMUNITY): Payer: Medicaid Other

## 2021-03-15 ENCOUNTER — Emergency Department (HOSPITAL_COMMUNITY)
Admission: EM | Admit: 2021-03-15 | Discharge: 2021-03-15 | Disposition: A | Discharge status: Home / Self Care | Payer: Medicaid Other | Attending: Emergency Medicine | Admitting: Emergency Medicine

## 2021-03-15 ENCOUNTER — Encounter: Payer: Medicaid Other | Admitting: Certified Nurse Midwife

## 2021-03-15 DIAGNOSIS — R1011 Right upper quadrant pain: Secondary | ICD-10-CM | POA: Diagnosis present

## 2021-03-15 DIAGNOSIS — K8020 Calculus of gallbladder without cholecystitis without obstruction: Secondary | ICD-10-CM

## 2021-03-15 LAB — COMPREHENSIVE METABOLIC PANEL
ALT: 60 U/L — ABNORMAL HIGH (ref 0–44)
AST: 111 U/L — ABNORMAL HIGH (ref 15–41)
Albumin: 3.3 g/dL — ABNORMAL LOW (ref 3.5–5.0)
Alkaline Phosphatase: 200 U/L — ABNORMAL HIGH (ref 38–126)
Anion gap: 8 (ref 5–15)
BUN: 8 mg/dL (ref 6–20)
CO2: 26 mmol/L (ref 22–32)
Calcium: 9 mg/dL (ref 8.9–10.3)
Chloride: 106 mmol/L (ref 98–111)
Creatinine, Ser: 0.65 mg/dL (ref 0.44–1.00)
GFR, Estimated: >60 mL/min (ref >60)
Glucose, Bld: 136 mg/dL — ABNORMAL HIGH (ref 70–99)
Potassium: 3.8 mmol/L (ref 3.5–5.1)
Sodium: 140 mmol/L (ref 135–145)
Total Bilirubin: 0.3 mg/dL (ref 0.3–1.2)
Total Protein: 6.4 g/dL — ABNORMAL LOW (ref 6.5–8.1)

## 2021-03-15 LAB — CBC WITH DIFFERENTIAL/PLATELET
Abs Immature Granulocytes: 0.04 10*3/uL (ref 0.00–0.07)
Basophils Absolute: 0.1 10*3/uL (ref 0.0–0.1)
Basophils Relative: 1 %
Eosinophils Absolute: 0.2 10*3/uL (ref 0.0–0.5)
Eosinophils Relative: 2 %
HCT: 33.8 % — ABNORMAL LOW (ref 36.0–46.0)
Hemoglobin: 10.9 g/dL — ABNORMAL LOW (ref 12.0–15.0)
Immature Granulocytes: 0 %
Lymphocytes Relative: 25 %
Lymphs Abs: 2.3 10*3/uL (ref 0.7–4.0)
MCH: 28.8 pg (ref 26.0–34.0)
MCHC: 32.2 g/dL (ref 30.0–36.0)
MCV: 89.2 fL (ref 80.0–100.0)
Monocytes Absolute: 0.5 10*3/uL (ref 0.1–1.0)
Monocytes Relative: 5 %
Neutro Abs: 6 10*3/uL (ref 1.7–7.7)
Neutrophils Relative %: 67 %
Platelets: 266 10*3/uL (ref 150–400)
RBC: 3.79 MIL/uL — ABNORMAL LOW (ref 3.87–5.11)
RDW: 12.7 % (ref 11.5–15.5)
WBC: 8.9 10*3/uL (ref 4.0–10.5)
nRBC: 0 % (ref 0.0–0.2)

## 2021-03-15 LAB — URINALYSIS, MICROSCOPIC (REFLEX)

## 2021-03-15 LAB — URINALYSIS, ROUTINE W REFLEX MICROSCOPIC
Bilirubin Urine: NEGATIVE
Glucose, UA: NEGATIVE mg/dL
Ketones, ur: NEGATIVE mg/dL
Nitrite: NEGATIVE
Protein, ur: NEGATIVE mg/dL
Specific Gravity, Urine: >1.03 — ABNORMAL HIGH (ref 1.005–1.030)
pH: 6 (ref 5.0–8.0)

## 2021-03-15 LAB — LIPASE, BLOOD: Lipase: 39 U/L (ref 11–51)

## 2021-03-15 MED ORDER — IOHEXOL 300 MG/ML  SOLN
80.0000 mL | Freq: Once | INTRAMUSCULAR | Status: AC | PRN
  Administered 2021-03-15: 80 mL via INTRAVENOUS

## 2021-03-15 MED ORDER — ONDANSETRON HCL 4 MG/2ML IJ SOLN
4.0000 mg | Freq: Once | INTRAMUSCULAR | Status: AC
  Administered 2021-03-15: 4 mg via INTRAVENOUS
  Filled 2021-03-15: qty 2

## 2021-03-15 MED ORDER — MORPHINE SULFATE (PF) 4 MG/ML IV SOLN
4.0000 mg | Freq: Once | INTRAVENOUS | Status: AC
  Administered 2021-03-15: 4 mg via INTRAVENOUS
  Filled 2021-03-15: qty 1

## 2021-03-15 MED ORDER — SODIUM CHLORIDE 0.9 % IV BOLUS
1000.0000 mL | Freq: Once | INTRAVENOUS | Status: AC
  Administered 2021-03-15: 1000 mL via INTRAVENOUS

## 2021-03-15 NOTE — Discharge Instructions (Addendum)
1) Follow up with General surgery if this continues to be a problem. Try making diet changes and avoiding fatty food.  2) Your blood counts are slightly low. Have counts rechecked when you follow up with PCP next. 3) if pain becomes constant, severe and you develop a fever and are unable to eat without vomiting return to ED.

## 2021-03-15 NOTE — ED Notes (Signed)
Pt verbalized understanding of d/c instructions, meds and followup care. Denies questions. VSS, no distress noted. Steady gait to exit with all belongings.  ?

## 2021-03-15 NOTE — ED Provider Triage Note (Signed)
Emergency Medicine Provider Triage Evaluation Note  Alexandria Gomez , a 26 y.o. female  was evaluated in triage.  Pt complains of RUQ abdominal pain after eating fried plantains this evening.  She does report nausea/vomiting.  Denies fever.    She is approx 2 weeks post-partum from normal vaginal delivery on 02/27/21.  No complications.  States minimal spotting currently.  Normal follow-up post-partum visit 03/10/21.  Review of Systems  Positive: RUQ pain Negative: fever  Physical Exam  BP 131/88    Pulse (!) 101    Temp 97.9 F (36.6 C) (Oral)    Resp 16    Ht 5\' 5"  (1.651 m)    Wt 95.3 kg    SpO2 100%    BMI 34.95 kg/m ]  Gen:   Awake, no distress   Resp:  Normal effort  MSK:   Moves extremities without difficulty  Other:  RUQ tenderness, appears uncomfortable  Medical Decision Making  Medically screening exam initiated at 2:16 AM.  Appropriate orders placed.  Alexandria Gomez was informed that the remainder of the evaluation will be completed by another provider, this initial triage assessment does not replace that evaluation, and the importance of remaining in the ED until their evaluation is complete.  RUQ pain after eating fried plantains.  Concern for possible gallbladder etiology.  Will check labs, UA, RUQ Alexandria Gomez.     [place], PA-C 03/15/21 0221

## 2021-03-15 NOTE — ED Provider Notes (Signed)
[facility] EMERGENCY DEPARTMENT Provider Note   CSN: [id number] Arrival date & time: 03/15/21  0156     History  Chief Complaint  Patient presents with   Abdominal Pain    Alexandria Gomez is a 26 y.o. female.   Abdominal Pain  Patient is a 26 year old female presenting due to right upper quadrant tenderness.  It started acutely at 1/12 AM this morning, its been colicky.  Waxes and wanes, is like a cramping pain that radiates to the back and across the upper abdomen.  Worse with inspiration, associated with nausea and vomiting.  Abdominal pain is worse postprandially.  Has not tried anything to make it better, of note, patient is 2 weeks postpartum vaginal delivery without complications.  Endorses mild vaginal bleeding since delivery, no dysuria.  PMH: migraines, PCOS  Patient is alternating between breast-feeding during the day and formula at night.  Past Medical History:  Diagnosis Date   Migraines    PCOS (polycystic ovarian syndrome)      Home Medications Prior to Admission medications   Medication Sig Start Date End Date Taking? Authorizing Provider  ibuprofen (ADVIL) 600 MG tablet Take 1 tablet (600 mg total) by mouth every 6 (six) hours. 02/28/21   Philip Aspen, CNM  Prenatal Vit-Fe Fum-Fe Bisg-FA (NATACHEW) 28-1 MG CHEW CHEW 1 TABLET BY MOUTH DAILY AT 12 NOON. 08/05/20   Philip Aspen, CNM  Prenatal Vit-Fe Fumarate-FA (PRENATAL PLUS VITAMIN/MINERAL) 27-1 MG TABS Take 30 mg by mouth daily. 08/05/20 08/05/21  Philip Aspen, CNM      Allergies    Patient has no known allergies.    Review of Systems   Review of Systems  Gastrointestinal:  Positive for abdominal pain.   Physical Exam Updated Vital Signs BP 123/78    Pulse 79    Temp 98 F (36.7 C) (Oral)    Resp 16    Ht $R'5\' 5"'NK$  (1.651 m)    Wt 95.3 kg    SpO2 100%    BMI 34.95 kg/m  Physical Exam Vitals and nursing note reviewed.  Constitutional:      General: She is not in acute  distress.    Appearance: She is well-developed.  HENT:     Head: Normocephalic and atraumatic.  Eyes:     Conjunctiva/sclera: Conjunctivae normal.  Cardiovascular:     Rate and Rhythm: Normal rate and regular rhythm.     Heart sounds: No murmur heard. Pulmonary:     Effort: Pulmonary effort is normal. No respiratory distress.     Breath sounds: Normal breath sounds.  Abdominal:     Palpations: Abdomen is soft.     Tenderness: There is abdominal tenderness in the right upper quadrant and epigastric area. There is guarding. There is no rebound. Positive signs include Murphy's sign.     Comments: Abdomen is soft, right upper quadrant tenderness as well as epigastric tenderness.  Voluntary guarding, no rigidity.  Positive Murphy.  No CVA tenderness.  Musculoskeletal:        General: No swelling.     Cervical back: Neck supple.  Skin:    General: Skin is warm and dry.     Capillary Refill: Capillary refill takes less than 2 seconds.  Neurological:     Mental Status: She is alert.  Psychiatric:        Mood and Affect: Mood normal.    ED Results / Procedures / Treatments   Labs (all labs ordered are listed, but only abnormal  results are displayed) Labs Reviewed  CBC WITH DIFFERENTIAL/PLATELET - Abnormal; Notable for the following components:      Result Value   RBC 3.79 (*)    Hemoglobin 10.9 (*)    HCT 33.8 (*)    All other components within normal limits  COMPREHENSIVE METABOLIC PANEL - Abnormal; Notable for the following components:   Glucose, Bld 136 (*)    Total Protein 6.4 (*)    Albumin 3.3 (*)    AST 111 (*)    ALT 60 (*)    Alkaline Phosphatase 200 (*)    All other components within normal limits  URINALYSIS, ROUTINE W REFLEX MICROSCOPIC - Abnormal; Notable for the following components:   APPearance HAZY (*)    Specific Gravity, Urine >1.030 (*)    Hgb urine dipstick LARGE (*)    Leukocytes,Ua TRACE (*)    All other components within normal limits  URINALYSIS,  MICROSCOPIC (REFLEX) - Abnormal; Notable for the following components:   Bacteria, UA FEW (*)    All other components within normal limits  LIPASE, BLOOD    EKG None  Radiology CT Abdomen Pelvis W Contrast  Result Date: 03/15/2021 CLINICAL DATA:  Nausea and vomiting.  Right upper quadrant pain. EXAM: CT ABDOMEN AND PELVIS WITH CONTRAST TECHNIQUE: Multidetector CT imaging of the abdomen and pelvis was performed using the standard protocol following bolus administration of intravenous contrast. RADIATION DOSE REDUCTION: This exam was performed according to the departmental dose-optimization program which includes automated exposure control, adjustment of the mA and/or kV according to patient size and/or use of iterative reconstruction technique. CONTRAST:  41mL OMNIPAQUE IOHEXOL 300 MG/ML  SOLN COMPARISON:  None. FINDINGS: Lower chest: No acute abnormality. Hepatobiliary: No suspicious liver abnormality. Gallbladder appears within normal limits for CT. No signs of bile duct dilatation. Pancreas: Unremarkable. No pancreatic ductal dilatation or surrounding inflammatory changes. Spleen: Normal in size without focal abnormality. Adrenals/Urinary Tract: Normal adrenal glands. No focal kidney abnormality identified. There is mild proximal left hydroureter up to the level of the uterine fundus. No urinary tract calculi identified. The bladder appears normal. Stomach/Bowel: Stomach appears normal. The appendix is visualized and is within normal limits. No signs of bowel wall thickening, inflammation, or distension. Vascular/Lymphatic: No significant vascular findings are present. No enlarged abdominal or pelvic lymph nodes. Reproductive: Uterus and bilateral adnexa are unremarkable. Other: No free fluid or fluid collections. Musculoskeletal: No acute or significant osseous findings. IMPRESSION: 1. No acute findings identified within the abdomen or pelvis. 2. Gallstones are better seen on ultrasound performed  earlier today. 3. Mild proximal left hydroureter up to the level of the uterine fundus. No urinary tract calculi identified. Findings may reflect residual changes of pregnancy. Alternatively, this may be seen with upper urinary tract infection. Electronically Signed   By: Kerby Moors M.D.   On: 03/15/2021 09:10   US Abdomen Limited RUQ (LIVER/GB)  Result Date: 03/15/2021 CLINICAL DATA:  Right upper quadrant pain. EXAM: ULTRASOUND ABDOMEN LIMITED RIGHT UPPER QUADRANT COMPARISON:  None. FINDINGS: Gallbladder: Stones and sludge are present within the gallbladder. No gallbladder wall thickening or pericholecystic fluid. A positive Murphy sign noted by sonographer. Common bile duct: Diameter: 2.7 mm. Liver: No focal lesion identified. Within normal limits in parenchymal echogenicity. Portal vein is patent on color Doppler imaging with normal direction of blood flow towards the liver. Other: No free fluid. IMPRESSION: Stones and sludge in the gallbladder. No gallbladder wall thickening or pericholecystic fluid. The sonographer reports a positive Murphy sign.  Correlate clinically to exclude cholecystitis. Electronically Signed   By: Brett Fairy M.D.   On: 03/15/2021 03:08    Procedures Procedures    Medications Ordered in ED Medications  sodium chloride 0.9 % bolus 1,000 mL (0 mLs Intravenous Stopped 03/15/21 0838)  ondansetron (ZOFRAN) injection 4 mg (4 mg Intravenous Given 03/15/21 0738)  morphine 4 MG/ML injection 4 mg (4 mg Intravenous Given 03/15/21 0738)  iohexol (OMNIPAQUE) 300 MG/ML solution 80 mL (80 mLs Intravenous Contrast Given 03/15/21 0855)    ED Course/ Medical Decision Making/ A&P                           Medical Decision Making Amount and/or Complexity of Data Reviewed Independent Historian: spouse    Details: Patient spouse at bedside providing additional information regarding timeline of events. External Data Reviewed: notes. Labs:  Decision-making details documented in ED  Course. Radiology: ordered. Decision-making details documented in ED Course. ECG/medicine tests:  Decision-making details documented in ED Course.  Risk Prescription drug management.   This is a 26 year old female presenting due to right upper quadrant pain.  Voluntary guarding on exam with positive Murphy sign.  Her vitals are stable, she is not febrile or tachycardic.  I reviewed her labor and delivery note as well as her recent postdelivery follow-up.  She has not having normal postdelivery course.  Labs and imaging reviewed by myself personally.  I agree with radiologist interpretation.  Although I do not see cholecystitis on the ultrasound, given her clinical presentation and the positive sonographic Murphy sign I think it is reasonable to assess with CT abdomen.  She is very tender with voluntary guarding. No lower abdominal tenderness, denies vaginal pain. Considered but doubt retained POC since delivery. Additionally, patient not septic.   Labs: CBC: Stable anemia. Increased from 2 weeks ago at time of delivery likely due to continued vaginal bleeding. No leukocytosis.  CMP: Elevated LFTs. Alk phos 200, increased from prior. AST>ALT, both elevated compared to 2 weeks prior per chart review.  Lipase: Low, not consistent with pancreatitis.  UA: hematuria consistent with continued vaginal bleeding. No evidence of UTI.  CT: cholelithiasis without cholecystitis. No acute findings. Reassuring overall.  Advised patient to follow up with PCP for recheck of blood counts. Advised general surgery follow up if symptoms persist. Diet changes also advised. Return precautions given. Discharged in stable conditoin.         Final Clinical Impression(s) / ED Diagnoses Final diagnoses:  RUQ pain  Calculus of gallbladder without cholecystitis without obstruction    Rx / DC Orders ED Discharge Orders     None         Sherrill Raring, PA-C 03/15/21 0930    Carmin Muskrat, MD 03/15/21  1536

## 2021-03-15 NOTE — ED Triage Notes (Signed)
Pt reported to ED with c/o right sided abdominal pain that radiates upwards and into x2 hrs accompanied with nausea. Report being 2 weeks postpartum.

## 2021-04-07 ENCOUNTER — Other Ambulatory Visit (HOSPITAL_COMMUNITY)
Admission: RE | Admit: 2021-04-07 | Discharge: 2021-04-07 | Disposition: A | Discharge status: Home / Self Care | Payer: Medicaid Other | Source: Ambulatory Visit | Attending: Obstetrics | Admitting: Obstetrics

## 2021-04-07 ENCOUNTER — Ambulatory Visit (INDEPENDENT_AMBULATORY_CARE_PROVIDER_SITE_OTHER): Payer: Medicaid Other | Admitting: Obstetrics

## 2021-04-07 ENCOUNTER — Other Ambulatory Visit: Payer: Self-pay

## 2021-04-07 VITALS — BP 119/82 | HR 82 | Ht 65.0 in | Wt 208.2 lb

## 2021-04-07 DIAGNOSIS — N898 Other specified noninflammatory disorders of vagina: Secondary | ICD-10-CM

## 2021-04-07 MED ORDER — FUSION PLUS PO CAPS: 1.0000 | ORAL_CAPSULE | Freq: Every day | ORAL | 2 refills | Status: DC

## 2021-04-07 NOTE — Progress Notes (Signed)
SUBJECTIVE  Alexandria Gomez is a 26 y.o. -year-old G2P1011 who gave birth to a l lb., 10 oz. female infant at 30 weeks via NSVD on 02/27/21 with epidudral anesthesia.  She had a second degree laceration that was repaired. She is bottle feeding. She is here today for a PP follow-up visit. Her postpartum course was complicated by gallstones. She has a consult with a surgeon scheduled to make a plan. Her infant's course has been complicated by repeated respiratory issues. She has been to the ED with him on several occasions and will be following up with pulmonology. She also recently found out that her house has black mold, and she is moving tomorrow. She has been coping well with all these stressors and endorses good support and a stable mood. She reports that her perineum is healing well, but she has noted a yellowish vaginal discharge. She desires Nexplanon for contraception. She has not yet resumed intercourse. She is looking forward to returning to work next month   ROS  Constitutional: negative for anorexia, chills, fatigue, and fevers Respiratory: negative for cough and wheezing Cardiovascular: negative for chest pain and palpitations Gastrointestinal: negative except for pain r/t gallstones Genitourinary:negative for dysuria and urinary incontinence Integument/breast: negative for breast lump and breast tenderness Musculoskeletal:negative except for occasional "pulling" sensation in abdomen Behavioral/Psych: negative for anxiety and depression   Mood: Depression screen negative EPDS: 2  Bowel function: Normal Bladder function: Had some incontinence but returning to normal Vaginal bleeding: None Pain: Denies  Denies difficulty breathing, chest pain, lower extremity pain or swelling, excessive vaginal bleeding, vaginal pain.   Infant: Bottle feeding. Doing well overall. Diagnosed with GERD. Has referral pulmonologist for recurrent respiratory concerns.  OBJECTIVE Last Weight  Most  recent update: 04/07/2021 11:03 AM    Weight  94.4 kg (208 lb 3.2 oz)            Body mass index is 34.65 kg/m.  BP 119/82    Pulse 82    Ht 5\' 5"  (1.651 m)    Wt 208 lb 3.2 oz (94.4 kg)    LMP  (LMP Unknown)    Breastfeeding No    BMI 34.65 kg/m  Patient has no known allergies.  Past Medical/Surgical History Past Medical History:  Diagnosis Date   Migraines    PCOS (polycystic ovarian syndrome)    Past Surgical History:  Procedure Laterality Date   wisdom teeth removal      Last Pap: approximate date 08/2020 and was normal  BP 119/82    Pulse 82    Ht 5\' 5"  (1.651 m)    Wt 208 lb 3.2 oz (94.4 kg)    LMP  (LMP Unknown)    Breastfeeding No    BMI 34.65 kg/m  General appearance: alert, cooperative, and appears stated age Head: Normocephalic, without obvious abnormality, atraumatic Lungs: clear to auscultation bilaterally Breasts: normal appearance, no masses or tenderness, Normal to palpation without dominant masses Heart: regular rate and rhythm, S1, S2 normal, no murmur, click, rub or gallop Abdomen: soft, non-tender; bowel sounds normal; no masses,  no organomegaly and fundus not palpable Pelvic: external genitalia normal and repair well-healed. Swab collected for vaginal discharge.  ASSESSMENT 1) 6 weeks postpartum. Normal recovery. 2) Anemia 3) Desires Nexplanon for contraception.  PLAN  1) Discussed PP mood changes and coping strategies and general self-care. 2) Iron supplement sent to pharmacy. 3) Schedule appointment for Nexplanon placement. Use barrier method for contraception if resuming intercourse before placement.  Return in 6-12 months after Nexplanon placement for annual exam or as needed with concerns. Pap due 2025.  Lloyd Huger, CNM

## 2021-04-10 ENCOUNTER — Other Ambulatory Visit: Payer: Self-pay | Admitting: Obstetrics

## 2021-04-10 ENCOUNTER — Ambulatory Visit: Payer: Self-pay | Admitting: Surgery

## 2021-04-10 NOTE — H&P (View-Only) (Signed)
History of Present Illness: °Alexandria Gomez is a 26 y.o. female who was referred to me for evaluation of gallstones. For the last few months she has been having pain in the upper abdomen, primarily in the RUQ. It tends to be worse in the evening, particularly after eating red meat or greasy food. The pain is associated with nausea. She is recently postpartum and delivered a baby (via vaginal delivery) on 02/27/21. She was seen in the ED on 1/18 with a severe episode of pain. Transaminases were mildly elevated at that time (AST/ALT 111/60, had been normal 1 month prior) and alk phos was 200 (from 165). Bilirubin was normal. A RUQ US showed gallstones with no signs of acute cholecystitis. A CT scan was also done and showed no other acute findings. She was discharged from the ED and referred for outpatient follow up. She has continued to have intermittent RUQ pain every few days. °  °She is otherwise in good health and has not had any prior surgeries. °  °  °Review of Systems: °A complete review of systems was obtained from the patient.  I have reviewed this information and discussed as appropriate with the patient.  See HPI as well for other ROS. °  °  °  °Medical History: °Past Medical History °Past Medical History: °Diagnosis Date ° Anemia   ° °  °  °Patient Active Problem List °Diagnosis ° Symptomatic cholelithiasis ° Elevated transaminase level °  °  °Past Surgical History °History reviewed. No pertinent surgical history. °  °  °Allergies °No Known Allergies ° °  °No current outpatient medications on file prior to visit. °  °No current facility-administered medications on file prior to visit. °  °  °Family History °Family History °Problem Relation Age of Onset ° High blood pressure (Hypertension) Mother   ° Hyperlipidemia (Elevated cholesterol) Mother   ° Diabetes Mother   ° Coronary Artery Disease (Blocked arteries around heart) Father   ° °  °  °Social History °  °Tobacco Use °Smoking Status Never °Smokeless  Tobacco Never °  °  °Social History °Social History °  ° °Socioeconomic History ° Marital status: Married °Tobacco Use ° Smoking status: Never ° Smokeless tobacco: Never °Substance and Sexual Activity ° Alcohol use: Yes °    Comment: 1 glass of wine occassionally ° Drug use: Never ° °  °  °Objective: °  °  °Vitals: °  04/10/21 1044 °BP: 120/68 °Pulse: 84 °Temp: 36.6 °C (97.8 °F) °SpO2: 99% °Weight: 95.1 kg (209 lb 9.6 oz) °Height: 165.1 cm (5' 5") °  °Body mass index is 34.88 kg/m². °  °Physical Exam °Vitals reviewed.  °Constitutional:   °   General: She is not in acute distress. °   Appearance: Normal appearance.  °HENT:  °   Head: Normocephalic and atraumatic.  °Eyes:  °   General: No scleral icterus. °   Conjunctiva/sclera: Conjunctivae normal.  °Cardiovascular:  °   Rate and Rhythm: Normal rate and regular rhythm.  °   Heart sounds: No murmur heard. °Pulmonary:  °   Effort: Pulmonary effort is normal. No respiratory distress.  °   Breath sounds: Normal breath sounds.  °Abdominal:  °   General: There is no distension.  °   Palpations: Abdomen is soft.  °   Tenderness: There is no abdominal tenderness.  °   Comments: No surgical scars.  °Musculoskeletal:     °   General: No swelling or deformity. Normal range of   motion.  °   Cervical back: Normal range of motion.  °Skin: °   General: Skin is warm and dry.  °   Coloration: Skin is not jaundiced.  °Neurological:  °   General: No focal deficit present.  °   Mental Status: She is alert and oriented to person, place, and time.  °Psychiatric:     °   Mood and Affect: Mood normal.     °   Behavior: Behavior normal.     °   Thought Content: Thought content normal.  °  °  °  °  °  °Labs, Imaging and Diagnostic Testing: °US abdomen 03/15/21: °IMPRESSION: °Stones and sludge in the gallbladder. No gallbladder wall thickening °or pericholecystic fluid. The sonographer reports a positive Murphy °sign. Correlate clinically to exclude cholecystitis. °  °Assessment and  Plan: °Diagnoses and all orders for this visit: °  °Symptomatic cholelithiasis °-     CCS Case Posting Request; Future °  °Elevated LFTs °-     CMP14+eGFR - [facility] °  °  °  °This is a 26-year-old female referred for evaluation of intermittent right upper quadrant abdominal pain.  I personally reviewed her ultrasound and CT scan, which show stones and sludge within the gallbladder.  There is no biliary ductal dilation or pericholecystic fluid.  Her symptoms are very consistent with biliary colic. Laparoscopic cholecystectomy was recommended. The details of this procedure were discussed with the patient, including the risks of bleeding, infection, bile leak, and <0.5% risk of common bile duct injury. The patient expressed understanding and agrees to proceed with surgery. Will also recheck her LFTs today - previous elevation in ED may be pregnancy-related, although labs were normal at the end of her pregnancy. If not downtrending will need further workup of this. I will call her with the lab results. She will be contacted to schedule an elective surgery date. ° °Nazia Rhines, MD °[facility] °General, Hepatobiliary and Pancreatic Surgery °04/10/21 11:07 AM ° °

## 2021-04-10 NOTE — H&P (Signed)
History of Present Illness: Alexandria Gomez is a 26 y.o. female who was referred to me for evaluation of gallstones. For the last few months she has been having pain in the upper abdomen, primarily in the RUQ. It tends to be worse in the evening, particularly after eating red meat or greasy food. The pain is associated with nausea. She is recently postpartum and delivered a baby (via vaginal delivery) on 02/27/21. She was seen in the ED on 1/18 with a severe episode of pain. Transaminases were mildly elevated at that time (AST/ALT 111/60, had been normal 1 month prior) and alk phos was 200 (from 165). Bilirubin was normal. A RUQ US showed gallstones with no signs of acute cholecystitis. A CT scan was also done and showed no other acute findings. She was discharged from the ED and referred for outpatient follow up. She has continued to have intermittent RUQ pain every few days.   She is otherwise in good health and has not had any prior surgeries.     Review of Systems: A complete review of systems was obtained from the patient.  I have reviewed this information and discussed as appropriate with the patient.  See HPI as well for other ROS.       Medical History: Past Medical History Past Medical History: Diagnosis Date  Anemia        Patient Active Problem List Diagnosis  Symptomatic cholelithiasis  Elevated transaminase level     Past Surgical History History reviewed. No pertinent surgical history.     Allergies No Known Allergies    No current outpatient medications on file prior to visit.   No current facility-administered medications on file prior to visit.     Family History Family History Problem Relation Age of Onset  High blood pressure (Hypertension) Mother    Hyperlipidemia (Elevated cholesterol) Mother    Diabetes Mother    Coronary Artery Disease (Blocked arteries around heart) Father        Social History   Tobacco Use Smoking Status Never Smokeless  Tobacco Never     Social History Social History    Socioeconomic History  Marital status: Married Tobacco Use  Smoking status: Never  Smokeless tobacco: Never Substance and Sexual Activity  Alcohol use: Yes     Comment: 1 glass of wine occassionally  Drug use: Never      Objective:     Vitals:   04/10/21 1044 BP: 120/68 Pulse: 84 Temp: 36.6 C (97.8 F) SpO2: 99% Weight: 95.1 kg (209 lb 9.6 oz) Height: 165.1 cm (_0 )   Body mass index is 34.88 kg/m.   Physical Exam Vitals reviewed.  Constitutional:      General: She is not in acute distress.    Appearance: Normal appearance.  HENT:     Head: Normocephalic and atraumatic.  Eyes:     General: No scleral icterus.    Conjunctiva/sclera: Conjunctivae normal.  Cardiovascular:     Rate and Rhythm: Normal rate and regular rhythm.     Heart sounds: No murmur heard. Pulmonary:     Effort: Pulmonary effort is normal. No respiratory distress.     Breath sounds: Normal breath sounds.  Abdominal:     General: There is no distension.     Palpations: Abdomen is soft.     Tenderness: There is no abdominal tenderness.     Comments: No surgical scars.  Musculoskeletal:        General: No swelling or deformity. Normal range of  motion.     Cervical back: Normal range of motion.  Skin:    General: Skin is warm and dry.     Coloration: Skin is not jaundiced.  Neurological:     General: No focal deficit present.     Mental Status: She is alert and oriented to person, place, and time.  Psychiatric:        Mood and Affect: Mood normal.        Behavior: Behavior normal.        Thought Content: Thought content normal.            Labs, Imaging and Diagnostic Testing: US abdomen 03/15/21: IMPRESSION: Stones and sludge in the gallbladder. No gallbladder wall thickening or pericholecystic fluid. The sonographer reports a positive Murphy sign. Correlate clinically to exclude cholecystitis.   Assessment and  Plan: Diagnoses and all orders for this visit:   Symptomatic cholelithiasis -     CCS Case Posting Request; Future   Elevated LFTs -     CMP14+eGFR - [facility]       This is a 26 year old female referred for evaluation of intermittent right upper quadrant abdominal pain.  I personally reviewed her ultrasound and CT scan, which show stones and sludge within the gallbladder.  There is no biliary ductal dilation or pericholecystic fluid.  Her symptoms are very consistent with biliary colic. Laparoscopic cholecystectomy was recommended. The details of this procedure were discussed with the patient, including the risks of bleeding, infection, bile leak, and <0.5% risk of common bile duct injury. The patient expressed understanding and agrees to proceed with surgery. Will also recheck her LFTs today - previous elevation in ED may be pregnancy-related, although labs were normal at the end of her pregnancy. If not downtrending will need further workup of this. I will call her with the lab results. She will be contacted to schedule an elective surgery date.  Michaelle Birks, MD [facility] General, Hepatobiliary and Pancreatic Surgery 04/10/21 11:07 AM

## 2021-04-11 ENCOUNTER — Other Ambulatory Visit: Payer: Self-pay | Admitting: Obstetrics

## 2021-04-11 ENCOUNTER — Encounter: Payer: Self-pay | Admitting: Obstetrics

## 2021-04-11 LAB — CERVICOVAGINAL ANCILLARY ONLY
Bacterial Vaginitis (gardnerella): POSITIVE — AB
Candida Glabrata: NEGATIVE
Candida Vaginitis: NEGATIVE
Chlamydia: NEGATIVE
Comment: NEGATIVE
Comment: NEGATIVE
Comment: NEGATIVE
Comment: NEGATIVE
Comment: NEGATIVE
Comment: NORMAL
Neisseria Gonorrhea: NEGATIVE
Trichomonas: NEGATIVE

## 2021-04-11 MED ORDER — METRONIDAZOLE 500 MG PO TABS: 500.0000 mg | ORAL_TABLET | Freq: Two times a day (BID) | ORAL | 0 refills | Status: DC

## 2021-04-11 MED ORDER — IRON (FERROUS SULFATE) 325 (65 FE) MG PO TABS: 1.0000 | ORAL_TABLET | Freq: Every day | ORAL | 3 refills | Status: DC

## 2021-04-11 NOTE — Progress Notes (Signed)
+   BV. Rx sent for metronidazole 500 mg PO BID x 7 days. Pt notified via MyChart.  M. Chryl Heck, CNM

## 2021-04-12 ENCOUNTER — Encounter: Payer: Self-pay | Admitting: Obstetrics

## 2021-04-17 NOTE — Patient Instructions (Addendum)
DUE TO COVID-19 ONLY ONE VISITOR IS ALLOWED TO COME WITH YOU AND STAY IN THE WAITING ROOM ONLY DURING PRE OP AND PROCEDURE.   **NO VISITORS ARE ALLOWED IN THE SHORT STAY AREA OR RECOVERY ROOM!!**  IF YOU WILL BE ADMITTED INTO THE HOSPITAL YOU ARE ALLOWED ONLY TWO SUPPORT PEOPLE DURING VISITATION HOURS ONLY (7 AM -8PM)   The support person(s) must pass our screening, gel in and out, and wear a mask at all times, including in the patients room. Patients must also wear a mask when staff or their support person are in the room. Visitors GUEST BADGE MUST BE WORN VISIBLY  One adult visitor may remain with you overnight and MUST be in the room by 8 P.M.  No visitors under the age of 107. Any visitor under the age of 94 must be accompanied by an adult.        Your procedure is scheduled on: 04/20/21   Report to [facility] Main Entrance    Report to admitting at : 10:00 AM   Call this number if you have problems the morning of surgery [phone]   Eat a light diet the day before surgery.  Examples including soups, broths, toast, yogurt, mashed potatoes.  Things to avoid include carbonated beverages (fizzy beverages), raw fruits and raw vegetables, or beans.   If your bowels are filled with gas, your surgeon will have difficulty visualizing your pelvic organs which increases your surgical risks.   Do not eat food :After Midnight.   May have liquids until : 9:00 AM   day of surgery  CLEAR LIQUID DIET  Foods Allowed                                                                     Foods Excluded  Water, Black Coffee and tea, regular and decaf                             liquids that you cannot  Plain Jell-O in any flavor  (No red)                                           see through such as: Fruit ices (not with fruit pulp)                                     milk, soups, orange juice              Iced Popsicles (No red)                                    All solid food                                    Apple juices Sports drinks like Gatorade (No red) Lightly seasoned clear broth or consume(fat free) Sugar Sample  Menu Breakfast                                Lunch                                     Supper Cranberry juice                    Beef broth                            Chicken broth Jell-O                                     Grape juice                           Apple juice Coffee or tea                        Jell-O                                      Popsicle                                                Coffee or tea                        Coffee or tea   FOLLOW BOWEL PREP AND ANY ADDITIONAL PRE OP INSTRUCTIONS YOU RECEIVED FROM YOUR SURGEON'S OFFICE!!!   Oral Hygiene is also important to reduce your risk of infection.                                    Remember - BRUSH YOUR TEETH THE MORNING OF SURGERY WITH YOUR REGULAR TOOTHPASTE   Do NOT smoke after Midnight   Take these medicines the morning of surgery with A SIP OF WATER: N/A  DO NOT TAKE ANY ORAL DIABETIC MEDICATIONS DAY OF YOUR SURGERY                              You may not have any metal on your body including hair pins, jewelry, and body piercing             Do not wear make-up, lotions, powders, perfumes/cologne, or deodorant  Do not wear nail polish including gel and S&S, artificial/acrylic nails, or any other type of covering on natural nails including finger and toenails. If you have artificial nails, gel coating, etc. that needs to be removed by a nail salon please have this removed prior to surgery or surgery may need to be canceled/ delayed if the surgeon/ anesthesia feels like they are unable to be safely monitored.   Do not shave  48 hours prior to surgery.    Do not bring valuables to the hospital.  [facility] IS NOT             RESPONSIBLE   FOR VALUABLES.   Contacts, dentures or bridgework may not be worn into surgery.   Bring small overnight bag day of  surgery.    Patients discharged on the day of surgery will not be allowed to drive home.  Someone needs to stay with you for the first 24 hours after anesthesia.   Special Instructions: Bring a copy of your healthcare power of attorney and living will documents         the day of surgery if you haven't scanned them before.              Please read over the following fact sheets you were given: IF YOU HAVE QUESTIONS ABOUT YOUR PRE-OP INSTRUCTIONS PLEASE CALL [phone]     [facility] - Preparing for Surgery Before surgery, you can play an important role.  Because skin is not sterile, your skin needs to be as free of germs as possible.  You can reduce the number of germs on your skin by washing with CHG (chlorahexidine gluconate) soap before surgery.  CHG is an antiseptic cleaner which kills germs and bonds with the skin to continue killing germs even after washing. Please DO NOT use if you have an allergy to CHG or antibacterial soaps.  If your skin becomes reddened/irritated stop using the CHG and inform your nurse when you arrive at Short Stay. Do not shave (including legs and underarms) for at least 48 hours prior to the first CHG shower.  You may shave your face/neck. Please follow these instructions carefully:  1.  Shower with CHG Soap the night before surgery and the  morning of Surgery.  2.  If you choose to wash your hair, wash your hair first as usual with your  normal  shampoo.  3.  After you shampoo, rinse your hair and body thoroughly to remove the  shampoo.                           4.  Use CHG as you would any other liquid soap.  You can apply chg directly  to the skin and wash                       Gently with a scrungie or clean washcloth.  5.  Apply the CHG Soap to your body ONLY FROM THE NECK DOWN.   Do not use on face/ open                           Wound or open sores. Avoid contact with eyes, ears mouth and genitals (private parts).                       Wash face,  Genitals  (private parts) with your normal soap.             6.  Wash thoroughly, paying special attention to the area where your surgery  will be performed.  7.  Thoroughly rinse your body with warm water from the neck down.  8.  DO NOT shower/wash with your normal soap after using and rinsing off  the CHG Soap.                9.  Pat yourself dry with a clean towel.  10.  Wear clean pajamas.            11.  Place clean sheets on your bed the night of your first shower and do not  sleep with pets. Day of Surgery : Do not apply any lotions/deodorants the morning of surgery.  Please wear clean clothes to the hospital/surgery center.  FAILURE TO FOLLOW THESE INSTRUCTIONS MAY RESULT IN THE CANCELLATION OF YOUR SURGERY PATIENT SIGNATURE_________________________________  NURSE SIGNATURE__________________________________  ________________________________________________________________________

## 2021-04-18 ENCOUNTER — Encounter (HOSPITAL_COMMUNITY)
Admission: RE | Admit: 2021-04-18 | Discharge: 2021-04-18 | Disposition: A | Discharge status: Home / Self Care | Payer: Medicaid Other | Source: Ambulatory Visit | Attending: Surgery | Admitting: Surgery

## 2021-04-18 ENCOUNTER — Encounter (HOSPITAL_COMMUNITY): Payer: Self-pay

## 2021-04-18 ENCOUNTER — Other Ambulatory Visit: Payer: Self-pay

## 2021-04-18 VITALS — BP 115/76 | HR 77 | Temp 98.5°F | Resp 18 | Ht 65.0 in | Wt 205.1 lb

## 2021-04-18 DIAGNOSIS — Z01812 Encounter for preprocedural laboratory examination: Secondary | ICD-10-CM | POA: Diagnosis not present

## 2021-04-18 DIAGNOSIS — Z01818 Encounter for other preprocedural examination: Secondary | ICD-10-CM

## 2021-04-18 HISTORY — DX: Anemia, unspecified: D64.9

## 2021-04-18 LAB — CBC
HCT: 36.1 % (ref 36.0–46.0)
Hemoglobin: 11.5 g/dL — ABNORMAL LOW (ref 12.0–15.0)
MCH: 26.9 pg (ref 26.0–34.0)
MCHC: 31.9 g/dL (ref 30.0–36.0)
MCV: 84.3 fL (ref 80.0–100.0)
Platelets: 267 10*3/uL (ref 150–400)
RBC: 4.28 MIL/uL (ref 3.87–5.11)
RDW: 14.4 % (ref 11.5–15.5)
WBC: 4.9 10*3/uL (ref 4.0–10.5)
nRBC: 0 % (ref 0.0–0.2)

## 2021-04-18 NOTE — Progress Notes (Addendum)
COVID Vaccine Completed: Yes Date COVID Vaccine completed: 02/26/20 x 3 COVID vaccine manufacturer: Pfizer     COVID Test: N/A Bowel prep reminder: N/A  PCP - Dr. Eustaquio Boyden Cardiologist -   Chest x-ray -  EKG -  Stress Test -  ECHO -  Cardiac Cath -  Pacemaker/ICD device last checked:  Sleep Study -  CPAP -   Fasting Blood Sugar -  Checks Blood Sugar _____ times a day  Blood Thinner Instructions: Aspirin Instructions: Last Dose:  Anesthesia review:   Patient denies shortness of breath, fever, cough and chest pain at PAT appointment   Patient verbalized understanding of instructions that were given to them at the PAT appointment. Patient was also instructed that they will need to review over the PAT instructions again at home before surgery.

## 2021-04-19 ENCOUNTER — Encounter (HOSPITAL_COMMUNITY): Payer: Self-pay | Admitting: Surgery

## 2021-04-19 ENCOUNTER — Encounter: Payer: Medicaid Other | Admitting: Obstetrics

## 2021-04-20 ENCOUNTER — Ambulatory Visit (HOSPITAL_COMMUNITY): Payer: Medicaid Other | Admitting: Anesthesiology

## 2021-04-20 ENCOUNTER — Ambulatory Visit (HOSPITAL_COMMUNITY)
Admission: RE | Admit: 2021-04-20 | Discharge: 2021-04-20 | Disposition: A | Discharge status: Home / Self Care | Payer: Medicaid Other | Attending: Surgery | Admitting: Surgery

## 2021-04-20 ENCOUNTER — Encounter (HOSPITAL_COMMUNITY)
Admission: RE | Disposition: A | Discharge status: Home / Self Care | Payer: Self-pay | Source: Home / Self Care | Attending: Surgery

## 2021-04-20 ENCOUNTER — Other Ambulatory Visit: Payer: Self-pay

## 2021-04-20 ENCOUNTER — Encounter (HOSPITAL_COMMUNITY): Payer: Self-pay | Admitting: Surgery

## 2021-04-20 ENCOUNTER — Ambulatory Visit (HOSPITAL_BASED_OUTPATIENT_CLINIC_OR_DEPARTMENT_OTHER): Payer: Medicaid Other | Admitting: Anesthesiology

## 2021-04-20 DIAGNOSIS — Z01818 Encounter for other preprocedural examination: Secondary | ICD-10-CM

## 2021-04-20 DIAGNOSIS — K8010 Calculus of gallbladder with chronic cholecystitis without obstruction: Secondary | ICD-10-CM | POA: Diagnosis not present

## 2021-04-20 DIAGNOSIS — K8020 Calculus of gallbladder without cholecystitis without obstruction: Secondary | ICD-10-CM | POA: Diagnosis not present

## 2021-04-20 HISTORY — PX: CHOLECYSTECTOMY: SHX55

## 2021-04-20 LAB — PREGNANCY, URINE: Preg Test, Ur: NEGATIVE

## 2021-04-20 SURGERY — LAPAROSCOPIC CHOLECYSTECTOMY
Anesthesia: General

## 2021-04-20 MED ORDER — ESMOLOL HCL 100 MG/10ML IV SOLN
INTRAVENOUS | Status: DC | PRN
  Administered 2021-04-20: 10 mg via INTRAVENOUS

## 2021-04-20 MED ORDER — SUGAMMADEX SODIUM 500 MG/5ML IV SOLN
INTRAVENOUS | Status: AC
  Filled 2021-04-20: qty 5

## 2021-04-20 MED ORDER — SUGAMMADEX SODIUM 500 MG/5ML IV SOLN
INTRAVENOUS | Status: DC | PRN
  Administered 2021-04-20: 400 mg via INTRAVENOUS

## 2021-04-20 MED ORDER — DEXMEDETOMIDINE (PRECEDEX) IN NS 20 MCG/5ML (4 MCG/ML) IV SYRINGE
PREFILLED_SYRINGE | INTRAVENOUS | Status: DC | PRN
  Administered 2021-04-20 (×2): 4 ug via INTRAVENOUS
  Administered 2021-04-20: 8 ug via INTRAVENOUS

## 2021-04-20 MED ORDER — PHENYLEPHRINE 40 MCG/ML (10ML) SYRINGE FOR IV PUSH (FOR BLOOD PRESSURE SUPPORT)
PREFILLED_SYRINGE | INTRAVENOUS | Status: DC | PRN
  Administered 2021-04-20 (×2): 40 ug via INTRAVENOUS

## 2021-04-20 MED ORDER — CHLORHEXIDINE GLUCONATE 0.12 % MT SOLN
15.0000 mL | Freq: Once | OROMUCOSAL | Status: AC
  Administered 2021-04-20: 15 mL via OROMUCOSAL

## 2021-04-20 MED ORDER — TRAMADOL HCL 50 MG PO TABS: 50.0000 mg | ORAL_TABLET | Freq: Four times a day (QID) | ORAL | 0 refills | Status: AC | PRN

## 2021-04-20 MED ORDER — FENTANYL CITRATE PF 50 MCG/ML IJ SOSY: 25.0000 ug | PREFILLED_SYRINGE | INTRAMUSCULAR | Status: DC | PRN

## 2021-04-20 MED ORDER — GABAPENTIN 300 MG PO CAPS
300.0000 mg | ORAL_CAPSULE | ORAL | Status: AC
  Administered 2021-04-20: 300 mg via ORAL
  Filled 2021-04-20: qty 1

## 2021-04-20 MED ORDER — PROPOFOL 10 MG/ML IV BOLUS
INTRAVENOUS | Status: DC | PRN
Start: 2021-04-20 — End: 2021-04-20
  Administered 2021-04-20: 150 mg via INTRAVENOUS
  Administered 2021-04-20: 50 mg via INTRAVENOUS

## 2021-04-20 MED ORDER — FENTANYL CITRATE (PF) 100 MCG/2ML IJ SOLN
INTRAMUSCULAR | Status: DC | PRN
  Administered 2021-04-20 (×2): 50 ug via INTRAVENOUS
  Administered 2021-04-20: 150 ug via INTRAVENOUS

## 2021-04-20 MED ORDER — ONDANSETRON HCL 4 MG/2ML IJ SOLN
INTRAMUSCULAR | Status: DC | PRN
  Administered 2021-04-20: 4 mg via INTRAVENOUS

## 2021-04-20 MED ORDER — ACETAMINOPHEN 500 MG PO TABS
1000.0000 mg | ORAL_TABLET | ORAL | Status: AC
  Administered 2021-04-20: 1000 mg via ORAL
  Filled 2021-04-20: qty 2

## 2021-04-20 MED ORDER — ROCURONIUM BROMIDE 10 MG/ML (PF) SYRINGE
PREFILLED_SYRINGE | INTRAVENOUS | Status: DC | PRN
  Administered 2021-04-20: 100 mg via INTRAVENOUS

## 2021-04-20 MED ORDER — FENTANYL CITRATE (PF) 250 MCG/5ML IJ SOLN
INTRAMUSCULAR | Status: AC
  Filled 2021-04-20: qty 5

## 2021-04-20 MED ORDER — LACTATED RINGERS IV SOLN
INTRAVENOUS | Status: DC | PRN
  Administered 2021-04-20: 1000 mL

## 2021-04-20 MED ORDER — LACTATED RINGERS IV SOLN: INTRAVENOUS | Status: DC

## 2021-04-20 MED ORDER — BUPIVACAINE-EPINEPHRINE (PF) 0.25% -1:200000 IJ SOLN
INTRAMUSCULAR | Status: AC
  Filled 2021-04-20: qty 30

## 2021-04-20 MED ORDER — CEFAZOLIN SODIUM-DEXTROSE 2-4 GM/100ML-% IV SOLN
2.0000 g | INTRAVENOUS | Status: AC
  Administered 2021-04-20: 2 g via INTRAVENOUS
  Filled 2021-04-20: qty 100

## 2021-04-20 MED ORDER — 0.9 % SODIUM CHLORIDE (POUR BTL) OPTIME
TOPICAL | Status: DC | PRN
  Administered 2021-04-20: 1000 mL

## 2021-04-20 MED ORDER — MIDAZOLAM HCL 2 MG/2ML IJ SOLN
INTRAMUSCULAR | Status: AC
  Filled 2021-04-20: qty 2

## 2021-04-20 MED ORDER — DEXAMETHASONE SODIUM PHOSPHATE 10 MG/ML IJ SOLN
INTRAMUSCULAR | Status: DC | PRN
  Administered 2021-04-20: 10 mg via INTRAVENOUS

## 2021-04-20 MED ORDER — LIDOCAINE 2% (20 MG/ML) 5 ML SYRINGE
INTRAMUSCULAR | Status: DC | PRN
  Administered 2021-04-20: 80 mg via INTRAVENOUS

## 2021-04-20 MED ORDER — DEXMEDETOMIDINE (PRECEDEX) IN NS 20 MCG/5ML (4 MCG/ML) IV SYRINGE
PREFILLED_SYRINGE | INTRAVENOUS | Status: AC
  Filled 2021-04-20: qty 5

## 2021-04-20 MED ORDER — PHENYLEPHRINE 40 MCG/ML (10ML) SYRINGE FOR IV PUSH (FOR BLOOD PRESSURE SUPPORT)
PREFILLED_SYRINGE | INTRAVENOUS | Status: AC
  Filled 2021-04-20: qty 10

## 2021-04-20 MED ORDER — BUPIVACAINE-EPINEPHRINE 0.25% -1:200000 IJ SOLN
INTRAMUSCULAR | Status: DC | PRN
  Administered 2021-04-20: 30 mL

## 2021-04-20 MED ORDER — MIDAZOLAM HCL 5 MG/5ML IJ SOLN
INTRAMUSCULAR | Status: DC | PRN
  Administered 2021-04-20: 2 mg via INTRAVENOUS

## 2021-04-20 MED ORDER — ORAL CARE MOUTH RINSE: 15.0000 mL | Freq: Once | OROMUCOSAL | Status: AC

## 2021-04-20 SURGICAL SUPPLY — 40 items
APPLIER CLIP 5 13 M/L LIGAMAX5 (MISCELLANEOUS) ×2
BAG COUNTER SPONGE SURGICOUNT (BAG) IMPLANT
CHLORAPREP W/TINT 26 (MISCELLANEOUS) ×2 IMPLANT
CLIP APPLIE 5 13 M/L LIGAMAX5 (MISCELLANEOUS) ×1 IMPLANT
COVER SURGICAL LIGHT HANDLE (MISCELLANEOUS) ×2 IMPLANT
DERMABOND ADVANCED (GAUZE/BANDAGES/DRESSINGS) ×1
DERMABOND ADVANCED .7 DNX12 (GAUZE/BANDAGES/DRESSINGS) ×1 IMPLANT
DRAPE C-ARM 42X120 X-RAY (DRAPES) IMPLANT
DRAPE SHEET LG 3/4 BI-LAMINATE (DRAPES) IMPLANT
ELECT L-HOOK LAP 45CM DISP (ELECTROSURGICAL)
ELECT PENCIL ROCKER SW 15FT (MISCELLANEOUS) ×2 IMPLANT
ELECT REM PT RETURN 15FT ADLT (MISCELLANEOUS) ×2 IMPLANT
ELECTRODE L-HOOK LAP 45CM DISP (ELECTROSURGICAL) IMPLANT
GLOVE SURG POLYISO LF SZ5.5 (GLOVE) ×2 IMPLANT
GLOVE SURG UNDER POLY LF SZ6 (GLOVE) ×2 IMPLANT
GOWN STRL REUS W/TWL LRG LVL3 (GOWN DISPOSABLE) ×2 IMPLANT
GOWN STRL REUS W/TWL XL LVL3 (GOWN DISPOSABLE) ×4 IMPLANT
GRASPER SUT TROCAR 14GX15 (MISCELLANEOUS) IMPLANT
HEMOSTAT SNOW SURGICEL 2X4 (HEMOSTASIS) IMPLANT
IRRIG SUCT STRYKERFLOW 2 WTIP (MISCELLANEOUS) ×2
IRRIGATION SUCT STRKRFLW 2 WTP (MISCELLANEOUS) ×1 IMPLANT
KIT BASIN OR (CUSTOM PROCEDURE TRAY) ×2 IMPLANT
KIT TURNOVER KIT A (KITS) IMPLANT
L-HOOK LAP DISP 36CM (ELECTROSURGICAL) ×2
LHOOK LAP DISP 36CM (ELECTROSURGICAL) ×1 IMPLANT
NDL INSUFFLATION 14GA 120MM (NEEDLE) IMPLANT
NEEDLE INSUFFLATION 14GA 120MM (NEEDLE) IMPLANT
POUCH SPECIMEN RETRIEVAL 10MM (ENDOMECHANICALS) ×2 IMPLANT
SCISSORS LAP 5X35 DISP (ENDOMECHANICALS) ×2 IMPLANT
SET CHOLANGIOGRAPH MIX (MISCELLANEOUS) IMPLANT
SET TUBE SMOKE EVAC HIGH FLOW (TUBING) ×2 IMPLANT
SLEEVE XCEL OPT CAN 5 100 (ENDOMECHANICALS) ×4 IMPLANT
SPIKE FLUID TRANSFER (MISCELLANEOUS) ×2 IMPLANT
SUT MNCRL AB 4-0 PS2 18 (SUTURE) ×2 IMPLANT
TOWEL OR 17X26 10 PK STRL BLUE (TOWEL DISPOSABLE) ×2 IMPLANT
TOWEL OR NON WOVEN STRL DISP B (DISPOSABLE) IMPLANT
TRAY LAPAROSCOPIC (CUSTOM PROCEDURE TRAY) ×2 IMPLANT
TROCAR BLADELESS OPT 5 100 (ENDOMECHANICALS) ×2 IMPLANT
TROCAR XCEL 12X100 BLDLESS (ENDOMECHANICALS) IMPLANT
TROCAR XCEL BLUNT TIP 100MML (ENDOMECHANICALS) IMPLANT

## 2021-04-20 NOTE — Anesthesia Preprocedure Evaluation (Addendum)
Anesthesia Evaluation  Patient identified by MRN, date of birth, ID band Patient awake    Reviewed: Allergy & Precautions, NPO status , Patient's Chart, lab work & pertinent test results  Airway Mallampati: II  TM Distance: >3 FB Neck ROM: Full    Dental no notable dental hx. (+) Teeth Intact, Dental Advisory Given   Pulmonary neg pulmonary ROS,    Pulmonary exam normal breath sounds clear to auscultation       Cardiovascular negative cardio ROS Normal cardiovascular exam Rhythm:Regular Rate:Normal     Neuro/Psych  Headaches, negative psych ROS   GI/Hepatic negative GI ROS, Neg liver ROS,   Endo/Other  PCOS  Renal/GU negative Renal ROS  negative genitourinary   Musculoskeletal negative musculoskeletal ROS (+)   Abdominal   Peds  Hematology negative hematology ROS (+)   Anesthesia Other Findings   Reproductive/Obstetrics                            Anesthesia Physical Anesthesia Plan  ASA: 2  Anesthesia Plan: General   Post-op Pain Management: Tylenol PO (pre-op)*   Induction: Intravenous  PONV Risk Score and Plan: 3 and Midazolam, Dexamethasone and Ondansetron  Airway Management Planned: Oral ETT  Additional Equipment:   Intra-op Plan:   Post-operative Plan: Extubation in OR  Informed Consent: I have reviewed the patients History and Physical, chart, labs and discussed the procedure including the risks, benefits and alternatives for the proposed anesthesia with the patient or authorized representative who has indicated his/her understanding and acceptance.     Dental advisory given  Plan Discussed with: CRNA  Anesthesia Plan Comments:         Anesthesia Quick Evaluation

## 2021-04-20 NOTE — Interval H&P Note (Signed)
History and Physical Interval Note:  04/20/2021 2:01 PM  Alexandria Gomez  has presented today for surgery, with the diagnosis of GALLSTONES.  The various methods of treatment have been discussed with the patient and family. After consideration of risks, benefits and other options for treatment, the patient has consented to  Procedure(s): LAPAROSCOPIC CHOLECYSTECTOMY (N/A) as a surgical intervention.  The patient's history has been reviewed, patient examined, no change in status, stable for surgery.  I have reviewed the patient's chart and labs.  Questions were answered to the patient's satisfaction.     Fritzi Mandes

## 2021-04-20 NOTE — Transfer of Care (Signed)
Immediate Anesthesia Transfer of Care Note  Patient: Alexandria Gomez  Procedure(s) Performed: LAPAROSCOPIC CHOLECYSTECTOMY  Patient Location: PACU  Anesthesia Type:General  Level of Consciousness: awake, alert  and oriented  Airway & Oxygen Therapy: Patient Spontanous Breathing and Patient connected to face mask oxygen  Post-op Assessment: Report given to RN and Post -op Vital signs reviewed and stable  Post vital signs: Reviewed and stable  Last Vitals:  Vitals Value Taken Time  BP    Temp    Pulse 79 04/20/21 1534  Resp 21 04/20/21 1534  SpO2 100 % 04/20/21 1534  Vitals shown include unvalidated device data.  Last Pain:  Vitals:   04/20/21 1008  TempSrc: Oral         Complications: No notable events documented.

## 2021-04-20 NOTE — Op Note (Signed)
Date: 04/20/21  Patient: Alexandria Gomez MRN: [id number]  Preoperative Diagnosis: Symptomatic cholelithiasis Postoperative Diagnosis: Same  Procedure: Laparoscopic cholecystectomy  Surgeon: Sophronia Simas, MD  EBL: Minimal  Anesthesia: General endotracheal  Specimens: Gallbladder  Indications: Ms. [name] is a 26 yo female recently postpartum, who presented with postprandial right upper quadrant abdominal pain.  Ultrasound showed cholelithiasis without evidence of acute cholecystitis.  After discussion of the risks and benefits of surgery, she agreed to proceed with elective cholecystectomy.  Findings: Cholelithiasis without signs of acute cholecystitis.  Procedure details: Informed consent was obtained in the preoperative area prior to the procedure. The patient was brought to the operating room and placed on the table in the supine position. General anesthesia was induced and appropriate lines and drains were placed for intraoperative monitoring. Perioperative antibiotics were administered per SCIP guidelines. The abdomen was prepped and draped in the usual sterile fashion. A pre-procedure timeout was taken verifying patient identity, surgical site and procedure to be performed.  A small infraumbilical skin incision was made, the subcutaneous tissue was divided with cautery, and the umbilical stalk was grasped and elevated. The fascia was incised and the peritoneal cavity was directly visualized. A 64mm Hassan trocar was placed. The peritoneal cavity was inspected with no evidence of visceral or vascular injury. Three 103mm ports were placed in the right subcostal margin, all under direct visualization. The fundus of the gallbladder was grasped and retracted cephalad. The infundibulum was retracted laterally.  The gallbladder appeared chronically inflamed.  The cystic triangle was dissected out using cautery and blunt dissection, and the critical view of safety was obtained. The cystic  duct and cystic artery were clipped and ligated, leaving two clips behind on the cystic duct and cystic artery stumps. The gallbladder was taken off the liver using cautery. The specimen was placed in ecosac and removed. The surgical site was irrigated with saline until the effluent was clear. Hemostasis was achieved in the gallbladder fossa using cautery. The cystic duct and artery stumps were visually inspected and there was no evidence of bile leak or bleeding. The ports were removed under direct visualization and the abdomen was desufflated. The umbilical port site fascia was closed with a 0 vicryl suture. The skin at all port sites was closed with 4-0 monocryl subcuticular suture. Dermabond was applied.  The patient tolerated the procedure well with no apparent complications.  All counts were correct x2 at the end of the procedure. The patient was extubated and taken to PACU in stable condition.  Sophronia Simas, MD 04/20/21 3:28 PM

## 2021-04-20 NOTE — Anesthesia Procedure Notes (Addendum)
Procedure Name: Intubation Date/Time: 04/20/2021 2:18 PM Performed by: Freddrick March, MD Pre-anesthesia Checklist: Patient identified, Emergency Drugs available, Suction available and Patient being monitored Patient Re-evaluated:Patient Re-evaluated prior to induction Oxygen Delivery Method: Circle System Utilized Preoxygenation: Pre-oxygenation with 100% oxygen Induction Type: IV induction Ventilation: Mask ventilation without difficulty Laryngoscope Size: 3 and Mac Grade View: Grade I Tube type: Oral Tube size: 7.0 mm Number of attempts: 1 Airway Equipment and Method: Stylet and Oral airway Placement Confirmation: ETT inserted through vocal cords under direct vision, positive ETCO2 and breath sounds checked- equal and bilateral Tube secured with: Tape Dental Injury: Teeth and Oropharynx as per pre-operative assessment

## 2021-04-20 NOTE — Discharge Instructions (Addendum)
CENTRAL  SURGERY DISCHARGE INSTRUCTIONS  Activity No heavy lifting greater than 15 pounds for 4 weeks after surgery. Ok to shower in 24 hours, but do not bathe or submerge incisions underwater. Do not drive while taking narcotic pain medication.  Wound Care Your incisions are covered with skin glue called Dermabond. This will peel off on its own over time. You may shower and allow warm soapy water to run over your incisions. Gently pat dry. Do not submerge your incision underwater. Monitor your incision for any new redness, tenderness, or drainage.  When to Call us: Fever greater than 100.5 New redness, drainage, or swelling at incision site Severe pain, nausea, or vomiting Jaundice (yellowing of the whites of the eyes or skin)  Follow-up You have an appointment scheduled with Dr. Freida Busman on May 12, 2021 at 9:35am. This will be at the [facility] Surgery office at [address], [address], [place], [place]. Please arrive at least 15 minutes prior to your scheduled appointment time.  For questions or concerns, please call the office at [phone].

## 2021-04-21 ENCOUNTER — Encounter (HOSPITAL_COMMUNITY): Payer: Self-pay | Admitting: Surgery

## 2021-04-21 NOTE — Anesthesia Postprocedure Evaluation (Signed)
Anesthesia Post Note  Patient: Alexandria Gomez  Procedure(s) Performed: LAPAROSCOPIC CHOLECYSTECTOMY     Patient location during evaluation: PACU Anesthesia Type: General Level of consciousness: awake and alert Pain management: pain level controlled Vital Signs Assessment: post-procedure vital signs reviewed and stable Respiratory status: spontaneous breathing, nonlabored ventilation, respiratory function stable and patient connected to nasal cannula oxygen Cardiovascular status: blood pressure returned to baseline and stable Postop Assessment: no apparent nausea or vomiting Anesthetic complications: no   No notable events documented.  Last Vitals:  Vitals:   04/20/21 1600 04/20/21 1622  BP: 132/85 130/84  Pulse: 96 83  Resp: 14 16  Temp:  36.7 C  SpO2: 99% 100%    Last Pain:  Vitals:   04/20/21 1622  TempSrc:   PainSc: 0-No pain   Pain Goal:                   Gordon Vandunk L Brinklee Cisse

## 2021-04-25 LAB — SURGICAL PATHOLOGY

## 2021-04-26 ENCOUNTER — Other Ambulatory Visit: Payer: Self-pay

## 2021-04-26 ENCOUNTER — Encounter: Payer: Self-pay | Admitting: Family Medicine

## 2021-04-26 ENCOUNTER — Ambulatory Visit (INDEPENDENT_AMBULATORY_CARE_PROVIDER_SITE_OTHER): Payer: Medicaid Other | Admitting: Obstetrics

## 2021-04-26 VITALS — BP 114/79 | HR 96 | Ht 65.0 in | Wt 204.0 lb

## 2021-04-26 DIAGNOSIS — Z3202 Encounter for pregnancy test, result negative: Secondary | ICD-10-CM | POA: Diagnosis not present

## 2021-04-26 DIAGNOSIS — Z30017 Encounter for initial prescription of implantable subdermal contraceptive: Secondary | ICD-10-CM | POA: Diagnosis not present

## 2021-04-26 LAB — POCT URINE PREGNANCY: Preg Test, Ur: NEGATIVE

## 2021-04-26 NOTE — Progress Notes (Signed)
ENCOUNTER FOR NEXPLANON INSERTION ? ?SUBJECTIVE ?Alexandria Gomez is a 26 y.o. G2P1011 who presents today for insertion of Nexplanon contraceptive device. She desires reversible long-term contraception. We have thoroughly reviewed the risks, benefits, and alternatives, and she has elected to proceed with Nexplanon insertion.  ? ?OBJECTIVE ?BP 114/79   Pulse 96   Ht 5\' 5"  (1.651 m)   Wt 204 lb (92.5 kg)   LMP  (LMP Unknown)   Breastfeeding No   BMI 33.95 kg/m?  ? ?UPT: Negative ? ?Procedure Note ?Right/Left Arm ?Sterile Preparation:  Betadine/Chloraprep ? ?Insertion site was selected 10 cm from the medial epicondyle and marked using a sterile marker. The procedure area was prepped in sterile fashion. Adequate anesthesia was achieved with 2 mL of 1% lidocaine injected subcutaneously. The Nexplanon applicator was inserted subcutaneously and the Nexplanon device was delivered. The applicator was removed from the insertion site. The capsule was palpated by myself and the patient to confirm satisfactory placement. Blood loss was minimal. A pressure dressing was applied. ? ?Shakeeta tolerated the procedure well with no complications. Standard post-procedure care and return precautions were explained.  ? ?Sebastian Ache, CNM  ?

## 2021-05-12 ENCOUNTER — Other Ambulatory Visit: Payer: Self-pay

## 2021-05-12 ENCOUNTER — Ambulatory Visit (INDEPENDENT_AMBULATORY_CARE_PROVIDER_SITE_OTHER): Payer: Medicaid Other | Admitting: Obstetrics

## 2021-05-12 VITALS — BP 145/90 | Ht 66.0 in | Wt 205.0 lb

## 2021-05-12 DIAGNOSIS — Z3046 Encounter for surveillance of implantable subdermal contraceptive: Secondary | ICD-10-CM | POA: Diagnosis not present

## 2021-05-12 MED ORDER — SLYND 4 MG PO TABS
1.0000 | ORAL_TABLET | Freq: Every day | ORAL | 3 refills | Status: DC
Start: 2021-05-12 — End: 2021-09-19

## 2021-05-12 NOTE — Progress Notes (Signed)
NEXPLANON REMOVAL  ?SUBJECTIVE ?Alexandria Gomez is a 26 y.o. woman who presents today for removal of her Nexplanon that was placed in her left arm on 04/26/21. She reports that she has been having stabbing pain when she moves her arm in certain ways or lifts things, including her baby. The device is palpable subdermally. She has pain with palpation. No s/s of infection at the site.  ? ?OBJECTIVE ?Vitals:  ? 05/12/21 1400  ?BP: (!) 145/90  ? ? ? ?Procedure Note ?Consent was obtained before beginning this procedure. ?The Nexplanon was palpated and the surrounding skin was prepped with iodine in sterile fashion. Adequate anesthesia was achieved with subdermal injection of 1% lidocaine. A skin incision was made over the distal aspect of the device. The capsule was lysed sharply and the device was removed with a hemostat. Hemostasis was achieved. The incision site was closed with with a steristrip and a pressure dressing was applied. The patient tolerated the procedure well. ? ?Standard post-procedure care and precautions were reviewed. Alexandria Gomez verbalized understanding. ? ?We discussed alterate birth control options. Alexandria Gomez is not a candidate of combined contraception due to her history of migraine with aura. She decided she would like to try a progestin-only pill. Rx sent to pharmacy. She may consider having the Nexplanon replaced at a later date.  ? ?Lloyd Huger, CNM  ?

## 2021-06-21 ENCOUNTER — Emergency Department (HOSPITAL_COMMUNITY)
Admission: EM | Admit: 2021-06-21 | Discharge: 2021-06-22 | Discharge status: Against Medical Advice | Payer: Medicaid Other

## 2021-06-21 NOTE — ED Notes (Signed)
PT called for triage X3 no response. Will attempt again shortly ?

## 2021-08-11 ENCOUNTER — Emergency Department (HOSPITAL_COMMUNITY)
Admission: EM | Admit: 2021-08-11 | Discharge: 2021-08-11 | Disposition: A | Discharge status: Home / Self Care | Payer: Medicaid Other | Attending: Emergency Medicine | Admitting: Emergency Medicine

## 2021-08-11 ENCOUNTER — Encounter (HOSPITAL_COMMUNITY): Payer: Self-pay

## 2021-08-11 ENCOUNTER — Ambulatory Visit
Admission: EM | Admit: 2021-08-11 | Discharge: 2021-08-11 | Disposition: A | Discharge status: Home / Self Care | Payer: Medicaid Other | Attending: Internal Medicine | Admitting: Internal Medicine

## 2021-08-11 ENCOUNTER — Other Ambulatory Visit: Payer: Self-pay

## 2021-08-11 DIAGNOSIS — T782XXA Anaphylactic shock, unspecified, initial encounter: Secondary | ICD-10-CM | POA: Diagnosis not present

## 2021-08-11 DIAGNOSIS — E876 Hypokalemia: Secondary | ICD-10-CM | POA: Diagnosis not present

## 2021-08-11 LAB — CBC WITH DIFFERENTIAL/PLATELET
Abs Immature Granulocytes: 0.02 10*3/uL (ref 0.00–0.07)
Basophils Absolute: 0.1 10*3/uL (ref 0.0–0.1)
Basophils Relative: 1 %
Eosinophils Absolute: 0.1 10*3/uL (ref 0.0–0.5)
Eosinophils Relative: 1 %
HCT: 39.9 % (ref 36.0–46.0)
Hemoglobin: 13.2 g/dL (ref 12.0–15.0)
Immature Granulocytes: 0 %
Lymphocytes Relative: 56 %
Lymphs Abs: 5.7 10*3/uL — ABNORMAL HIGH (ref 0.7–4.0)
MCH: 27.7 pg (ref 26.0–34.0)
MCHC: 33.1 g/dL (ref 30.0–36.0)
MCV: 83.6 fL (ref 80.0–100.0)
Monocytes Absolute: 0.7 10*3/uL (ref 0.1–1.0)
Monocytes Relative: 7 %
Neutro Abs: 3.5 10*3/uL (ref 1.7–7.7)
Neutrophils Relative %: 35 %
Platelets: 255 10*3/uL (ref 150–400)
RBC: 4.77 MIL/uL (ref 3.87–5.11)
RDW: 16.2 % — ABNORMAL HIGH (ref 11.5–15.5)
WBC: 10.1 10*3/uL (ref 4.0–10.5)
nRBC: 0 % (ref 0.0–0.2)

## 2021-08-11 LAB — COMPREHENSIVE METABOLIC PANEL
ALT: 43 U/L (ref 0–44)
AST: 30 U/L (ref 15–41)
Albumin: 4.1 g/dL (ref 3.5–5.0)
Alkaline Phosphatase: 115 U/L (ref 38–126)
Anion gap: 11 (ref 5–15)
BUN: 9 mg/dL (ref 6–20)
CO2: 21 mmol/L — ABNORMAL LOW (ref 22–32)
Calcium: 9.3 mg/dL (ref 8.9–10.3)
Chloride: 107 mmol/L (ref 98–111)
Creatinine, Ser: 0.84 mg/dL (ref 0.44–1.00)
GFR, Estimated: >60 mL/min (ref >60)
Glucose, Bld: 110 mg/dL — ABNORMAL HIGH (ref 70–99)
Potassium: 3.1 mmol/L — ABNORMAL LOW (ref 3.5–5.1)
Sodium: 139 mmol/L (ref 135–145)
Total Bilirubin: 0.6 mg/dL (ref 0.3–1.2)
Total Protein: 7.1 g/dL (ref 6.5–8.1)

## 2021-08-11 LAB — I-STAT BETA HCG BLOOD, ED (MC, WL, AP ONLY): I-stat hCG, quantitative: <5 m[IU]/mL (ref <5)

## 2021-08-11 MED ORDER — SODIUM CHLORIDE 0.9 % IV SOLN: Freq: Once | INTRAVENOUS | Status: AC

## 2021-08-11 MED ORDER — EPINEPHRINE 0.3 MG/0.3ML IJ SOAJ: 0.3000 mg | INTRAMUSCULAR | 0 refills | Status: AC | PRN

## 2021-08-11 MED ORDER — EPINEPHRINE 1 MG/10ML IJ SOSY
0.3000 mg | PREFILLED_SYRINGE | Freq: Once | INTRAMUSCULAR | Status: AC
  Administered 2021-08-11: 0.3 mg via INTRAMUSCULAR

## 2021-08-11 MED ORDER — PREDNISONE 10 MG (21) PO TBPK: ORAL_TABLET | Freq: Every day | ORAL | 0 refills | Status: DC

## 2021-08-11 MED ORDER — FAMOTIDINE IN NACL 20-0.9 MG/50ML-% IV SOLN
20.0000 mg | Freq: Once | INTRAVENOUS | Status: AC
  Administered 2021-08-11: 20 mg via INTRAVENOUS
  Filled 2021-08-11: qty 50

## 2021-08-11 MED ORDER — CETIRIZINE HCL 10 MG PO TABS: 10.0000 mg | ORAL_TABLET | Freq: Every day | ORAL | 0 refills | Status: DC

## 2021-08-11 MED ORDER — METHYLPREDNISOLONE SODIUM SUCC 125 MG IJ SOLR
80.0000 mg | Freq: Once | INTRAMUSCULAR | Status: AC
  Administered 2021-08-11: 80 mg via INTRAMUSCULAR
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
## 2021-08-11 NOTE — ED Provider Notes (Signed)
[facility] EMERGENCY DEPARTMENT Provider Note   CSN: [id number] Arrival date & time: 08/11/21  1937     History {Add pertinent medical, surgical, social history, OB history to HPI:1} Chief Complaint  Patient presents with  . Allergic Reaction    Alexandria Gomez is a 26 y.o. female.  Patient as above with significant medical history as below, including anemia, migraines, PCOS who presents to the ED with complaint of concern for anaphylaxis.  Patient was seen in urgent care, she was having difficulty breathing, rash, felt like her "throat was closing."  She was given epinephrine IM by urgent care, Solu-Medrol 80 mg, Benadryl 50 mg.  Symptoms did improve significantly after intervention.  She was brought to the ED by EMS for further evaluation.  Patient without any significant known allergies.  Has never had an episode of anaphylaxis in the past, never used an EpiPen.  Has not seen an allergist previously.  Reports that she had some hives a few months ago which resolved spontaneously.  She is unsure what she was exposed to in the last 24 hours include potential triggered her reaction.  No recent soaps or lotions that are new.  No significant diet changes.  No recent medication changes  Patient denies any family history of angioedema that she is aware of.   Past Medical History:  Diagnosis Date  . Anemia   . Migraines   . PCOS (polycystic ovarian syndrome)     Past Surgical History:  Procedure Laterality Date  . CHOLECYSTECTOMY N/A 04/20/2021   LAPAROSCOPIC CHOLECYSTECTOMY;  Surgeon: Fritzi Mandes, MD;  Location: [facility]  . wisdom teeth removal       The history is provided by the patient and the EMS personnel. No language interpreter was used.  Allergic Reaction Presenting symptoms: difficulty swallowing and rash        Home Medications Prior to Admission medications   Medication Sig Start Date End Date Taking? Authorizing Provider  cetirizine  (ZYRTEC ALLERGY) 10 MG tablet Take 1 tablet (10 mg total) by mouth daily for 14 days. 08/11/21 08/25/21 Yes Tanda Rockers A, DO  EPINEPHrine 0.3 mg/0.3 mL IJ SOAJ injection Inject 0.3 mg into the muscle as needed for anaphylaxis. 08/11/21  Yes Tanda Rockers A, DO  metroNIDAZOLE (FLAGYL) 500 MG tablet Take 1 tablet (500 mg total) by mouth 2 (two) times daily. 04/11/21   Glenetta Borg, CNM  predniSONE (STERAPRED UNI-PAK 21 TAB) 10 MG (21) TBPK tablet Take by mouth daily. Take 6 tabs by mouth daily  for 2 days, then 5 tabs for 2 days, then 4 tabs for 2 days, then 3 tabs for 2 days, 2 tabs for 2 days, then 1 tab by mouth daily for 2 days 08/11/21  Yes Tanda Rockers A, DO  Drospirenone (SLYND) 4 MG TABS Take 1 tablet by mouth daily. 05/12/21   Glenetta Borg, CNM  Iron-FA-B Cmp-C-Biot-Probiotic (FUSION PLUS) CAPS Take 1 tablet by mouth daily. 04/07/21   Glenetta Borg, CNM      Allergies    Patient has no known allergies.    Review of Systems   Review of Systems  Constitutional:  Negative for chills and fever.  HENT:  Positive for trouble swallowing. Negative for facial swelling.   Eyes:  Negative for photophobia and visual disturbance.  Respiratory:  Positive for shortness of breath. Negative for cough.   Cardiovascular:  Negative for chest pain and palpitations.  Gastrointestinal:  Negative for abdominal pain,  nausea and vomiting.  Endocrine: Negative for polydipsia and polyuria.  Genitourinary:  Negative for difficulty urinating and hematuria.  Musculoskeletal:  Negative for gait problem and joint swelling.  Skin:  Positive for rash. Negative for pallor.  Neurological:  Negative for syncope and headaches.  Psychiatric/Behavioral:  Negative for agitation and confusion.     Physical Exam Updated Vital Signs BP 136/68 (BP Location: Left Arm)   Pulse (!) 116   Temp 98.3 F (36.8 C) (Oral)   Resp (!) 21   SpO2 100%  Physical Exam Vitals and nursing note reviewed.  Constitutional:       General: She is not in acute distress.    Appearance: Normal appearance.  HENT:     Head: Normocephalic and atraumatic. No raccoon eyes, Battle's sign, right periorbital erythema or left periorbital erythema.     Jaw: There is normal jaw occlusion. No trismus or tenderness.     Comments: No angioedema no drooling, trismus or stridor    Right Ear: External ear normal.     Left Ear: External ear normal.     Nose: Nose normal.     Mouth/Throat:     Mouth: Mucous membranes are moist.  Eyes:     General: No scleral icterus.       Right eye: No discharge.        Left eye: No discharge.     Extraocular Movements: Extraocular movements intact.     Pupils: Pupils are equal, round, and reactive to light.  Cardiovascular:     Rate and Rhythm: Normal rate and regular rhythm.     Pulses: Normal pulses.     Heart sounds: Normal heart sounds.  Pulmonary:     Effort: Pulmonary effort is normal. No respiratory distress.     Breath sounds: Normal breath sounds.  Abdominal:     General: Abdomen is flat.     Tenderness: There is no abdominal tenderness.  Musculoskeletal:        General: Normal range of motion.     Cervical back: Normal range of motion.     Right lower leg: No edema.     Left lower leg: No edema.  Skin:    General: Skin is warm and dry.     Capillary Refill: Capillary refill takes less than 2 seconds.     Comments: No appreciable rash  Neurological:     Mental Status: She is alert.  Psychiatric:        Mood and Affect: Mood normal.        Behavior: Behavior normal.    ED Results / Procedures / Treatments   Labs (all labs ordered are listed, but only abnormal results are displayed) Labs Reviewed  COMPREHENSIVE METABOLIC PANEL - Abnormal; Notable for the following components:      Result Value   Potassium 3.1 (*)    CO2 21 (*)    Glucose, Bld 110 (*)    All other components within normal limits  CBC WITH DIFFERENTIAL/PLATELET - Abnormal; Notable for the following  components:   RDW 16.2 (*)    All other components within normal limits  I-STAT VENOUS BLOOD GAS, ED  I-STAT BETA HCG BLOOD, ED ([facility], [facility], [facility] ONLY)    EKG None  Radiology No results found.  Procedures Procedures  {Document cardiac monitor, telemetry assessment procedure when appropriate:1}  Medications Ordered in ED Medications  famotidine (PEPCID) IVPB 20 mg premix (20 mg Intravenous New Bag/Given 08/11/21 1948)  0.9 %  sodium  chloride infusion ( Intravenous New Bag/Given 08/11/21 1946)    ED Course/ Medical Decision Making/ A&P                           Medical Decision Making Amount and/or Complexity of Data Reviewed Labs: ordered.  Risk OTC drugs. Prescription drug management.    CC: Allergic reaction  This patient presents to the Emergency Department for the above complaint. This involves an extensive number of treatment options and is a complaint that carries with it a high risk of complications and morbidity. Vital signs were reviewed. Serious etiologies considered.  DDx includes not limited to anaphylaxis, urticaria, allergic reaction, contact dermatitis, other acute etiologies are considered  Record review:  Previous records obtained and reviewed  Urgent care note, recent general surgery notes, prior labs and imaging  Additional history obtained from EMS  Medical and surgical history as noted above.   Work up as above, notable for:  Labs & imaging results that were available during my care of the patient were visualized by me and considered in my medical decision making.    Cardiac monitoring reviewed and interpreted personally which shows NSR  Management: We will give patient Pepcid, IV fluids  Reassessment:  Patient is feeling much better.  No recurrence of symptoms that she experienced in urgent care.  She is breathing comfortably on ambient air.-Epinephrine was given approximately 6:30 PM.  We will start patient on antihistamine, give steroid  taper.  EpiPen prescription for home.  Outpatient follow-up with allergist, advised to use unscented soaps and lotions, scented detergents.  Return if symptoms re-occur  Admission was considered.      The patient improved significantly and was discharged in stable condition. Detailed discussions were had with the patient regarding current findings, and need for close f/u with PCP or on call doctor. The patient has been instructed to return immediately if the symptoms worsen in any way for re-evaluation. Patient verbalized understanding and is in agreement with current care plan. All questions answered prior to discharge.            Social determinants of health include -  Social History   Socioeconomic History  . Marital status: Married    Spouse name: Not on file  . Number of children: Not on file  . Years of education: Not on file  . Highest education level: Not on file  Occupational History  . Not on file  Tobacco Use  . Smoking status: Never  . Smokeless tobacco: Never  Vaping Use  . Vaping Use: Never used  Substance and Sexual Activity  . Alcohol use: Not Currently    Comment: Occasionally  . Drug use: Never  . Sexual activity: Yes    Partners: Male    Birth control/protection: Implant  Other Topics Concern  . 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  Despite best efforts to proofread,  errors can occur which  can change the documentation meaning.   {Document critical care time when appropriate:1} {Document review of labs and clinical decision tools ie heart score, Chads2Vasc2 etc:1}  {Document your independent review of radiology images, and any outside records:1} {Document your discussion with family members, caretakers, and with consultants:1} {Document social determinants of health affecting pt's care:1} {Document your decision making why or why not admission, treatments were needed:1} Final Clinical Impression(s) / ED Diagnoses Final diagnoses:  Anaphylaxis, initial encounter    Rx / DC Orders ED Discharge Orders          Ordered    cetirizine (ZYRTEC ALLERGY) 10 MG tablet  Daily        08/11/21 2032    EPINEPHrine 0.3 mg/0.3 mL IJ SOAJ injection  As needed        08/11/21 2032    predniSONE (STERAPRED UNI-PAK 21 TAB) 10 MG (21) TBPK tablet  Daily        08/11/21 2032

## 2021-08-11 NOTE — Discharge Instructions (Signed)
Patient sent to hospital via EMS.  

## 2021-08-11 NOTE — Discharge Instructions (Addendum)
Please follow-up with your primary doctor(s) within 2-3 days. Please avoid any known triggers of your allergies.  We have given you a prescription for an Epi-Pen. Please pick it up as soon as possible. Always carry this with you. Only use the Epi-Pen in the event of a severe allergic reaction with trouble breathing or throat swelling. You must go to the hospital right away if you ever use the Epi-Pen. Remember that they expire every year so you should have your doctor write a new prescription yearly. We have also given you a prescription for prednisone and an antihistamine. Please pick it up as soon as possible and use as directed.  °  °Please return to the Emergency Department right away if you have any worsening or new shortness of breath, changes in your voice, tightness/itching in your mouth/throat, swelling, severe hives, chest pain, high fever. There is a very small chance of a recurrence of the allergic reaction, typically in the next 24 hours. If you see the same symptoms (rash, trouble breathing, vomiting, etc) return, come back to the Emergency Department immediately. °  °Please return to the emergency department immediately for any new or concerning symptoms, or if you get worse. °

## 2021-08-11 NOTE — ED Notes (Signed)
Provider at bedside

## 2021-08-11 NOTE — ED Triage Notes (Signed)
Per ems patient was eating a salad and patient reports that he lips were swelling and started to have itchy hives. Patient took 50mg  po benadryl, went to urgent care. (Gave her 0.3mg  of epi and 80mg  of solumedrol) EMS gave 50mg  IV benadryl. EMS reports that vitals are [facility] patient alert and oriented and appears to have no difficulty breathing

## 2021-08-25 ENCOUNTER — Other Ambulatory Visit: Payer: Self-pay | Admitting: Nurse Practitioner

## 2021-08-25 DIAGNOSIS — R1013 Epigastric pain: Secondary | ICD-10-CM

## 2021-08-25 DIAGNOSIS — N6310 Unspecified lump in the right breast, unspecified quadrant: Secondary | ICD-10-CM

## 2021-08-25 DIAGNOSIS — N6320 Unspecified lump in the left breast, unspecified quadrant: Secondary | ICD-10-CM

## 2021-08-26 ENCOUNTER — Encounter (HOSPITAL_COMMUNITY): Payer: Self-pay | Admitting: *Deleted

## 2021-08-26 ENCOUNTER — Emergency Department (HOSPITAL_COMMUNITY)
Admission: EM | Admit: 2021-08-26 | Discharge: 2021-08-26 | Disposition: A | Discharge status: Home / Self Care | Payer: Medicaid Other | Attending: Emergency Medicine | Admitting: Emergency Medicine

## 2021-08-26 ENCOUNTER — Other Ambulatory Visit: Payer: Self-pay

## 2021-08-26 DIAGNOSIS — L509 Urticaria, unspecified: Secondary | ICD-10-CM | POA: Diagnosis not present

## 2021-08-26 DIAGNOSIS — R110 Nausea: Secondary | ICD-10-CM | POA: Insufficient documentation

## 2021-08-26 DIAGNOSIS — T7840XA Allergy, unspecified, initial encounter: Secondary | ICD-10-CM | POA: Diagnosis present

## 2021-08-26 MED ORDER — FAMOTIDINE IN NACL 20-0.9 MG/50ML-% IV SOLN
20.0000 mg | Freq: Once | INTRAVENOUS | Status: AC
  Administered 2021-08-26: 20 mg via INTRAVENOUS
  Filled 2021-08-26: qty 50

## 2021-08-26 MED ORDER — DIPHENHYDRAMINE HCL 50 MG/ML IJ SOLN
25.0000 mg | Freq: Once | INTRAMUSCULAR | Status: AC
  Administered 2021-08-26: 25 mg via INTRAVENOUS
  Filled 2021-08-26: qty 1

## 2021-08-26 MED ORDER — PREDNISONE 20 MG PO TABS: 40.0000 mg | ORAL_TABLET | Freq: Every day | ORAL | 0 refills | Status: DC

## 2021-08-26 MED ORDER — METHYLPREDNISOLONE SODIUM SUCC 125 MG IJ SOLR
125.0000 mg | Freq: Once | INTRAMUSCULAR | Status: AC
Start: 2021-08-26 — End: 2021-08-26
  Administered 2021-08-26: 125 mg via INTRAVENOUS
  Filled 2021-08-26: qty 2

## 2021-08-26 NOTE — ED Provider Notes (Signed)
[facility] EMERGENCY DEPARTMENT Provider Note   CSN: [id number] Arrival date & time: 08/26/21  1928     History  Chief Complaint  Patient presents with   allergicr reaction    Alexandria Gomez is a 26 y.o. female.  Patient presents to the emergency to department today for evaluation of itching and nausea.  Patient has a history of anaphylaxis, was seen in the emergency department and treated on 08/11/2021.  Unknown trigger at that time.  Patient was eating tonight when the symptoms began.  This was around 6:30 PM.  She took 25 mg of Benadryl but continued to have intense itching on her extremities, prompting emergency department visit.  She denies vomiting or diarrhea, lightheadedness or syncope.  She was prescribed an EpiPen at last visit but did not use this tonight.  Denies lip or tongue swelling, difficulty breathing or shortness of breath.      Home Medications Prior to Admission medications   Medication Sig Start Date End Date Taking? Authorizing Provider  metroNIDAZOLE (FLAGYL) 500 MG tablet Take 1 tablet (500 mg total) by mouth 2 (two) times daily. 04/11/21   Glenetta Borg, CNM  cetirizine (ZYRTEC ALLERGY) 10 MG tablet Take 1 tablet (10 mg total) by mouth daily for 14 days. 08/11/21 08/25/21  Sloan Leiter, DO  Drospirenone (SLYND) 4 MG TABS Take 1 tablet by mouth daily. 05/12/21   Glenetta Borg, CNM  EPINEPHrine 0.3 mg/0.3 mL IJ SOAJ injection Inject 0.3 mg into the muscle as needed for anaphylaxis. 08/11/21   Tanda Rockers A, DO  Iron-FA-B Cmp-C-Biot-Probiotic (FUSION PLUS) CAPS Take 1 tablet by mouth daily. 04/07/21   Glenetta Borg, CNM  predniSONE (STERAPRED UNI-PAK 21 TAB) 10 MG (21) TBPK tablet Take by mouth daily. Take 6 tabs by mouth daily  for 2 days, then 5 tabs for 2 days, then 4 tabs for 2 days, then 3 tabs for 2 days, 2 tabs for 2 days, then 1 tab by mouth daily for 2 days 08/11/21   Sloan Leiter, DO      Allergies    Patient has  no known allergies.    Review of Systems   Review of Systems  Physical Exam Updated Vital Signs BP (!) 154/105 (BP Location: Left Arm)   Pulse (!) 116   Temp 98.3 F (36.8 C) (Oral)   Resp (!) 22   Ht 5\' 6"  (1.676 m)   Wt 93 kg   LMP 08/24/2021   SpO2 100%   BMI 33.09 kg/m  Physical Exam Vitals and nursing note reviewed.  Constitutional:      General: She is not in acute distress.    Appearance: She is well-developed.  HENT:     Head: Normocephalic and atraumatic.     Right Ear: External ear normal.     Left Ear: External ear normal.     Nose: Nose normal.     Mouth/Throat:     Comments: No lip or tongue swelling.  No posterior pharyngeal edema. Eyes:     Conjunctiva/sclera: Conjunctivae normal.  Cardiovascular:     Rate and Rhythm: Normal rate and regular rhythm.     Heart sounds: No murmur heard. Pulmonary:     Effort: No respiratory distress.     Breath sounds: No wheezing, rhonchi or rales.  Abdominal:     Palpations: Abdomen is soft.     Tenderness: There is no abdominal tenderness. There is no guarding or rebound.  Musculoskeletal:  Cervical back: Normal range of motion and neck supple.     Right lower leg: No edema.     Left lower leg: No edema.  Skin:    General: Skin is warm and dry.     Findings: No rash.     Comments: Diffuse hives mostly coalesced on the thighs but also involves the distal extremities.  Excoriations noted.  Neurological:     General: No focal deficit present.     Mental Status: She is alert. Mental status is at baseline.     Motor: No weakness.  Psychiatric:        Mood and Affect: Mood normal.   ED Results / Procedures / Treatments   Labs (all labs ordered are listed, but only abnormal results are displayed) Labs Reviewed - No data to display  EKG None  Radiology No results found.  Procedures Procedures    Medications Ordered in ED Medications  methylPREDNISolone sodium succinate (SOLU-MEDROL) 125 mg/2 mL  injection 125 mg (has no administration in time range)  diphenhydrAMINE (BENADRYL) injection 25 mg (has no administration in time range)  famotidine (PEPCID) IVPB 20 mg premix (has no administration in time range)    ED Course/ Medical Decision Making/ A&P    Patient seen and examined. History obtained directly from patient.   Labs/EKG: None ordered  Imaging: None ordered  Medications/Fluids: Ordered: IV Solu-Medrol, IV Benadryl, IV Pepcid. Considered administration of: Epinephrine, however patient is not having any airway difficulties significant GI symptoms, or hypotension.  She would like to hold off at this time.  Most recent vital signs reviewed and are as follows: BP (!) 154/105 (BP Location: Left Arm)   Pulse (!) 116   Temp 98.3 F (36.8 C) (Oral)   Resp (!) 22   Ht 5\' 6"  (1.676 m)   Wt 93 kg   LMP 08/24/2021   SpO2 100%   BMI 33.09 kg/m   Initial impression: Urticaria  10:40 PM Reassessment performed. Patient appears improved.  No longer itching.  No nausea or vomiting.  Hives nearly resolved.  Reviewed pertinent lab work and imaging with patient at bedside. Questions answered.   Most current vital signs reviewed and are as follows: BP 115/71   Pulse 70   Temp 98.3 F (36.8 C) (Oral)   Resp 16   Ht 5\' 6"  (1.676 m)   Wt 93 kg   LMP 08/24/2021   SpO2 99%   BMI 33.09 kg/m   Plan: Discharge to home.   Prescriptions written for: Prednisone  Other home care instructions discussed: Use of over-the-counter antihistamines such as diphenhydramine, loratadine, famotidine  ED return instructions discussed: Worsening allergic reaction with signs of anaphylaxis.  Follow-up instructions discussed: Patient encouraged to follow-up with their PCP as planned.  She has an appointment later this month.                           Medical Decision Making Risk Prescription drug management.   Patient with allergic reaction, primary symptom urticaria without signs of  severe anaphylaxis.  Resolved in the emergency department with treatment.  No sign of associated infection.         Final Clinical Impression(s) / ED Diagnoses Final diagnoses:  Allergic reaction, initial encounter    Rx / DC Orders ED Discharge Orders          Ordered    predniSONE (DELTASONE) 20 MG tablet  Daily  08/26/21 2236              Renne Crigler, PA-C 08/26/21 2241    Terald Sleeper, MD 08/26/21 2249

## 2021-08-26 NOTE — ED Notes (Signed)
Patient verbalizes understanding of discharge instructions. Opportunity for questioning and answers were provided. Armband removed by staff, pt discharged from ED. Pt ambulatory to ED entrance.  

## 2021-08-26 NOTE — Discharge Instructions (Signed)
Please read and follow all provided instructions.  Your diagnoses today include:  1. Allergic reaction, initial encounter    Tests performed today include: Vital signs. See below for your results today.   Medications prescribed:  Prednisone - steroid medicine   It is best to take this medication in the morning to prevent sleeping problems. If you are diabetic, monitor your blood sugar closely and stop taking Prednisone if blood sugar is over 300. Take with food to prevent stomach upset.   You may also take medications over-the-counter such as Benadryl or loratadine  Take any prescribed medications only as directed.  Home care instructions:  Follow any educational materials contained in this packet  Follow-up instructions: Please follow-up with your primary care provider when able for further evaluation of your symptoms.   Return instructions:  Please return to the Emergency Department if you experience worsening symptoms.  Call [phone] immediately if you have an allergic reaction that involves your lips, mouth, throat or if you have any difficulty breathing. This is a life-threatening emergency.  Please return if you have any other emergent concerns.  Additional Information:  Your vital signs today were: BP 115/71   Pulse 70   Temp 98.3 F (36.8 C) (Oral)   Resp 16   Ht 5\' 6"  (1.676 m)   Wt 93 kg   LMP 08/24/2021   SpO2 99%   BMI 33.09 kg/m  If your blood pressure (BP) was elevated above 135/85 this visit, please have this repeated by your doctor within one month. --------------

## 2021-08-26 NOTE — ED Triage Notes (Signed)
The pt had a rash and itching that started around 1830 after she ate  not sure what caused the rash  she took benadryl 25mg  po  sge still continues to itch.  She had a similar episode  around June the 16rh  first time ever.  She is not having any diff breathing at present

## 2021-09-01 ENCOUNTER — Other Ambulatory Visit: Payer: Medicaid Other

## 2021-09-04 ENCOUNTER — Other Ambulatory Visit: Payer: Medicaid Other

## 2021-09-05 ENCOUNTER — Ambulatory Visit
Admission: RE | Admit: 2021-09-05 | Discharge: 2021-09-05 | Disposition: A | Discharge status: Home / Self Care | Payer: Medicaid Other | Source: Ambulatory Visit | Attending: Nurse Practitioner | Admitting: Nurse Practitioner

## 2021-09-05 DIAGNOSIS — R1013 Epigastric pain: Secondary | ICD-10-CM

## 2021-09-15 ENCOUNTER — Other Ambulatory Visit: Payer: Self-pay | Admitting: Nurse Practitioner

## 2021-09-15 ENCOUNTER — Ambulatory Visit
Admission: RE | Admit: 2021-09-15 | Discharge: 2021-09-15 | Disposition: A | Discharge status: Home / Self Care | Payer: Medicaid Other | Source: Ambulatory Visit | Attending: Nurse Practitioner | Admitting: Nurse Practitioner

## 2021-09-15 DIAGNOSIS — N6320 Unspecified lump in the left breast, unspecified quadrant: Secondary | ICD-10-CM

## 2021-09-15 DIAGNOSIS — N6310 Unspecified lump in the right breast, unspecified quadrant: Secondary | ICD-10-CM
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
## 2021-09-19 ENCOUNTER — Ambulatory Visit (INDEPENDENT_AMBULATORY_CARE_PROVIDER_SITE_OTHER): Payer: Medicaid Other | Admitting: Allergy

## 2021-09-19 ENCOUNTER — Encounter: Payer: Self-pay | Admitting: Allergy

## 2021-09-19 VITALS — BP 114/78 | HR 78 | Temp 98.2°F | Resp 18 | Ht 65.0 in | Wt 185.0 lb

## 2021-09-19 DIAGNOSIS — T781XXA Other adverse food reactions, not elsewhere classified, initial encounter: Secondary | ICD-10-CM

## 2021-09-19 DIAGNOSIS — R0989 Other specified symptoms and signs involving the circulatory and respiratory systems: Secondary | ICD-10-CM | POA: Insufficient documentation

## 2021-09-19 DIAGNOSIS — T7840XD Allergy, unspecified, subsequent encounter: Secondary | ICD-10-CM

## 2021-09-19 DIAGNOSIS — J3089 Other allergic rhinitis: Secondary | ICD-10-CM

## 2021-09-19 DIAGNOSIS — T7840XA Allergy, unspecified, initial encounter: Secondary | ICD-10-CM | POA: Insufficient documentation

## 2021-09-19 MED ORDER — OMEPRAZOLE 40 MG PO CPDR: 40.0000 mg | DELAYED_RELEASE_CAPSULE | Freq: Every day | ORAL | 0 refills | Status: DC

## 2021-09-19 NOTE — Assessment & Plan Note (Signed)
Developed globus sensation after these episodes.  . Start taking omeprazole 40mg  daily in the morning x 1 month. Nothing to eat or drink for 30 minutes afterwards.  . If no improvement then recommend GI evaluation next.

## 2021-09-19 NOTE — Assessment & Plan Note (Addendum)
3 episodes of allergic reactions (mainly pruritus and hives) within 15 minutes of eating a meal. Each time she had a different meal with no common ingredients and she has eaten most of those ingredients since then with no reaction. Denies any changes in meds, personal care products or recent infections. No insect/bug bites.  Today's skin prick testing showed: Positive to grass, mold. Borderline positive to tree and almond.  . Based on clinical history and above test results not sure what exactly she had a reaction to. . Avoid the 2 dishes that you had reactions to. . Recommend to avoid soy and sesame as last 2 meals that she had a reaction to contained those top food allergens.   Epinephrine injectable device - demonstrated proper use. For mild symptoms you can take over the counter antihistamines such as Benadryl and monitor symptoms closely. If symptoms worsen or if you have severe symptoms including breathing issues, throat closure, significant swelling, whole body hives, severe diarrhea and vomiting, lightheadedness then inject epinephrine and seek immediate medical care afterwards.  Emergency action plan given.  Let me know if you have any other reactions.   Get bloodwork as below.

## 2021-09-19 NOTE — Patient Instructions (Addendum)
Today's skin testing showed: Positive to grass, mold. Borderline positive to tree and almond.   Results given.  Allergic reactions Avoid the 2 dishes that you had reactions to. I'm not sure what exactly you had a reaction to. Based on the list, recommend to avoid soy and sesame.   Epinephrine injectable device - demonstrated proper use. For mild symptoms you can take over the counter antihistamines such as Benadryl and monitor symptoms closely. If symptoms worsen or if you have severe symptoms including breathing issues, throat closure, significant swelling, whole body hives, severe diarrhea and vomiting, lightheadedness then inject epinephrine and seek immediate medical care afterwards. Emergency action plan given. Let me know if you have any other reactions.  Get bloodwork We are ordering labs, so please allow 1-2 weeks for the results to come back. With the newly implemented Cures Act, the labs might be visible to you at the same time that they become visible to me. However, I will not address the results until all of the results are back, so please be patient.  In the meantime, continue recommendations in your patient instructions, including avoidance measures (if applicable), until you hear from me.  Globus sensation Start taking omeprazole 40mg  daily in the morning x 1 month. Nothing to eat or drink for 30 minutes afterwards.  If not improved then recommend GI evaluation next.   Environmental allergies Start environmental control measures as below. Use over the counter antihistamines such as Zyrtec (cetirizine), Claritin (loratadine), Allegra (fexofenadine), or Xyzal (levocetirizine) daily as needed. May take twice a day during allergy flares. May switch antihistamines every few months.  Follow up in 3 months or sooner if needed.    Reducing Pollen Exposure Pollen seasons: trees (spring), grass (summer) and ragweed/weeds (fall). Keep windows closed in your home and car to lower  pollen exposure.  Install air conditioning in the bedroom and throughout the house if possible.  Avoid going out in dry windy days - especially early morning. Pollen counts are highest between 5 - 10 AM and on dry, hot and windy days.  Save outside activities for late afternoon or after a heavy rain, when pollen levels are lower.  Avoid mowing of grass if you have grass pollen allergy. Be aware that pollen can also be transported indoors on people and pets.  Dry your clothes in an automatic dryer rather than hanging them outside where they might collect pollen.  Rinse hair and eyes before bedtime.  Mold Control Mold and fungi can grow on a variety of surfaces provided certain temperature and moisture conditions exist.  Outdoor molds grow on plants, decaying vegetation and soil. The major outdoor mold, Alternaria and Cladosporium, are found in very high numbers during hot and dry conditions. Generally, a late summer - fall peak is seen for common outdoor fungal spores. Rain will temporarily lower outdoor mold spore count, but counts rise rapidly when the rainy period ends. The most important indoor molds are Aspergillus and Penicillium. Dark, humid and poorly ventilated basements are ideal sites for mold growth. The next most common sites of mold growth are the bathroom and the kitchen. Outdoor (Seasonal) Mold Control Use air conditioning and keep windows closed. Avoid exposure to decaying vegetation. Avoid leaf raking. Avoid grain handling. Consider wearing a face mask if working in moldy areas.  Indoor (Perennial) Mold Control  Maintain humidity below 50%. Get rid of mold growth on hard surfaces with water, detergent and, if necessary, 5% bleach (do not mix with other cleaners). Then  dry the area completely. If mold covers an area more than 10 square feet, consider hiring an indoor environmental professional. For clothing, washing with soap and water is best. If moldy items cannot be cleaned  and dried, throw them away. Remove sources e.g. contaminated carpets. Repair and seal leaking roofs or pipes. Using dehumidifiers in damp basements may be helpful, but empty the water and clean units regularly to prevent mildew from forming. All rooms, especially basements, bathrooms and kitchens, require ventilation and cleaning to deter mold and mildew growth. Avoid carpeting on concrete or damp floors, and storing items in damp areas.

## 2021-09-19 NOTE — Assessment & Plan Note (Signed)
Mild symptoms in the spring. Takes zyrtec prn with good benefit.  Today's skin prick testing showed: Positive to grass, mold. Borderline positive to tree.   Start environmental control measures. Use over the counter antihistamines such as Zyrtec (cetirizine), Claritin (loratadine), Allegra (fexofenadine), or Xyzal (levocetirizine) daily as needed. May take twice a day during allergy flares. May switch antihistamines every few months.

## 2021-09-22 ENCOUNTER — Other Ambulatory Visit: Payer: Medicaid Other

## 2021-09-22 ENCOUNTER — Ambulatory Visit
Admission: RE | Admit: 2021-09-22 | Discharge: 2021-09-22 | Disposition: A | Discharge status: Home / Self Care | Payer: Medicaid Other | Source: Ambulatory Visit | Attending: Nurse Practitioner | Admitting: Nurse Practitioner

## 2021-09-22 DIAGNOSIS — N6310 Unspecified lump in the right breast, unspecified quadrant: Secondary | ICD-10-CM

## 2021-09-22 LAB — ALLERGEN PROFILE, SHELLFISH
Clam IgE: <0.1 kU/L
F023-IgE Crab: <0.1 kU/L
F080-IgE Lobster: <0.1 kU/L
F290-IgE Oyster: <0.1 kU/L
Scallop IgE: <0.1 kU/L
Shrimp IgE: 0.14 kU/L — AB

## 2021-09-22 LAB — ALLERGEN SESAME F10: Sesame Seed IgE: <0.1 kU/L

## 2021-09-22 LAB — TRYPTASE: Tryptase: 2.7 ug/L (ref 2.2–13.2)

## 2021-09-22 LAB — ALLERGEN SOYBEAN: Soybean IgE: <0.1 kU/L

## 2021-11-01 ENCOUNTER — Ambulatory Visit (INDEPENDENT_AMBULATORY_CARE_PROVIDER_SITE_OTHER): Payer: Medicaid Other | Admitting: Obstetrics

## 2021-11-01 ENCOUNTER — Encounter: Payer: Self-pay | Admitting: Obstetrics

## 2021-11-01 VITALS — BP 111/77 | HR 88 | Ht 65.0 in | Wt 182.0 lb

## 2021-11-01 DIAGNOSIS — Z01419 Encounter for gynecological examination (general) (routine) without abnormal findings: Secondary | ICD-10-CM

## 2021-11-01 DIAGNOSIS — Z3200 Encounter for pregnancy test, result unknown: Secondary | ICD-10-CM

## 2021-11-01 LAB — POCT URINE PREGNANCY: Preg Test, Ur: NEGATIVE

## 2021-11-01 NOTE — Progress Notes (Signed)
SUBJECTIVE  HPI  Alexandria Gomez is a 26 y.o.-year-old female who presents for an annual physical today. Her periods are irregular due to PCOS, and her LMP was in June. She was taking POPs for contraception. Shortly after starting the pills, she began having severe allergic reactions to previously tolerated foods. She stopped taking the POPs and has been using condoms. She does not desire any future pregnancies and is interested in permanent sterilization. She denies abnormal vaginal bleeding or discharge, dyspareunia, and UTI symptoms. She does report frequent cramps. She has multiple life stressors and some periods of depression but feels she is well supported by her family and friends.  Medical/Surgical History Past Medical History:  Diagnosis Date   Anemia    Eczema    Migraines    PCOS (polycystic ovarian syndrome)    Past Surgical History:  Procedure Laterality Date   CHOLECYSTECTOMY N/A 04/20/2021   LAPAROSCOPIC CHOLECYSTECTOMY;  Surgeon: Fritzi Mandes, MD;  Location: [facility]   wisdom teeth removal      Social History Lives with husband, baby, and teenage brothers who she is raising Work: Psychologist, sport and exercise Exercise: walking up stairs, stays busy at work Substances: denies EtOH, tobacco, vape, and recreational drugs  Obstetric History OB History     Gravida  2   Para  1   Term  1   Preterm      AB  1   Living  1      SAB  1   IAB      Ectopic      Multiple  0   Live Births  1            GYN/Menstrual History Patient's last menstrual period was 08/14/2021 (exact date). Irregular periods (PCOS) Last Pap: 08/31/20 - normal Contraception: condoms  Prevention Dentist: visit scheduled next month Eye exam: regular Mammogram: recently had biopsy of benign cyst Colonoscopy: at 47  Current Medications Outpatient Medications Prior to Visit  Medication Sig   EPINEPHrine 0.3 mg/0.3 mL IJ SOAJ injection Inject 0.3 mg into the muscle as needed for  anaphylaxis.   omeprazole (PRILOSEC) 40 MG capsule Take 1 capsule (40 mg total) by mouth daily.   cetirizine (ZYRTEC ALLERGY) 10 MG tablet Take 1 tablet (10 mg total) by mouth daily for 14 days.   No facility-administered medications prior to visit.      The pregnancy intention screening data noted above was reviewed. Potential methods of contraception were discussed. The patient elected to proceed with No data recorded.   ROS History obtained from the patient General ROS: negative for - chills, fatigue, fever, or hot flashes Psychological ROS: situational depression at times Ophthalmic ROS: negative for - blurry vision or decreased vision ENT ROS: positive for - migraines negative for - visual changes Hematological and Lymphatic ROS: negative for - bleeding problems, bruising, or swollen lymph nodes Endocrine ROS: negative for - breast changes, palpitations, or polydipsia/polyuria Breast ROS: positive for - stable breast lump, recently biopsied Respiratory ROS: no cough, shortness of breath, or wheezing Cardiovascular ROS: no chest pain or dyspnea on exertion Gastrointestinal ROS: no abdominal pain, change in bowel habits, or black or bloody stools Genito-Urinary ROS: no dysuria, trouble voiding, or hematuria Musculoskeletal ROS: negative Dermatological ROS: negative     03/11/2020    2:40 PM  Depression screen PHQ 2/9  Decreased Interest 0  Down, Depressed, Hopeless 0  PHQ - 2 Score 0  Altered sleeping 0  Tired, decreased energy 0  Change in appetite 3  Feeling bad or failure about yourself  0  Trouble concentrating 2  Moving slowly or fidgety/restless 0  Suicidal thoughts 0  PHQ-9 Score 5     OBJECTIVE  Last Weight  Most recent update: 11/01/2021  8:59 AM    Weight  82.6 kg (182 lb)             Body mass index is 30.29 kg/m.   Negative UPT  BP 111/77   Pulse 88   Ht 5\' 5"  (1.651 m)   Wt 182 lb (82.6 kg)   LMP 08/14/2021 (Exact Date)   Breastfeeding No    BMI 30.29 kg/m  General appearance: alert, cooperative, and appears stated age Head: Normocephalic, without obvious abnormality, atraumatic Eyes: negative findings: lids and lashes normal and conjunctivae and sclerae normal Neck: no adenopathy, supple, symmetrical, trachea midline, and thyroid not enlarged, symmetric, no tenderness/mass/nodules Lungs: clear to auscultation bilaterally Breasts:  declined Heart: regular rate and rhythm, S1, S2 normal, no murmur, click, rub or gallop Abdomen: soft, non-tender; bowel sounds normal; no masses,  no organomegaly Pelvic: deferred Extremities: extremities normal, atraumatic, no cyanosis or edema Pulses: 2+ and symmetric Skin: Skin color, texture, turgor normal. No rashes or lesions Lymph nodes: Cervical, supraclavicular, and axillary nodes normal.  ASSESSMENT  1) Annual exam 2) Desires permanent sterilization  PLAN 1) Physical exam as noted. Declines labs today. Pap due in 2025. Discussed healthy lifestyle choices including increased exercise to decrease insulin resistance. 2) Schedule consult for BTL with MD  Return in one year for annual exam or as needed for concerns.   2026, CNM

## 2021-11-20 NOTE — Progress Notes (Unsigned)
    GYNECOLOGY PROGRESS NOTE  Subjective:    Patient ID: Alexandria Gomez, female    DOB: Jul 31, 1995, 26 y.o.   MRN: [id number]  HPI  Patient is a 26 y.o. G65P1011 female who presents to discuss bilateral tubal ligation.  The following portions of the patient's history were reviewed and updated as appropriate: allergies, current medications, past family history, past medical history, past social history, past surgical history, and problem list.  Review of Systems Pertinent items are noted in HPI.   Objective:   Last menstrual period 08/14/2021, not currently breastfeeding. There is no height or weight on file to calculate BMI. General appearance: alert, cooperative, and no distress Abdomen: {abdominal exam:16834} Pelvic: {pelvic exam:16852::"cervix normal in appearance","external genitalia normal","no adnexal masses or tenderness","no cervical motion tenderness","rectovaginal septum normal","uterus normal size, shape, and consistency","vagina normal without discharge"} Extremities: {extremity exam:5109} Neurologic: {neuro exam:17854}   Assessment:   No diagnosis found.   Plan:   There are no diagnoses linked to this encounter.    Rubie Maid, MD [facility]

## 2021-11-20 NOTE — Patient Instructions (Addendum)
GYNECOLOGY PRE-OPERATIVE INSTRUCTIONS  You are scheduled for surgery on 01/08/2022.  The name of your procedure is: Laparoscopic Bilateral Tubal Ligation.   Please read through these instructions carefully regarding preparation for your surgery: Nothing to eat after midnight on the day prior to surgery.  Do not take any medications unless recommended by your provider on day prior to surgery.  Do not take NSAIDs (Motrin, Aleve) or aspirin 7 days prior to surgery.  You may take Tylenol products for minor aches and pains.  You will receive a prescription for pain medications post-operatively.  You will be contacted by phone approximately 1-2 weeks prior to surgery to schedule your pre-operative appointment.   Please call the office if you have any questions regarding your upcoming surgery.    Thank you for choosing [facility].

## 2021-11-21 ENCOUNTER — Ambulatory Visit (INDEPENDENT_AMBULATORY_CARE_PROVIDER_SITE_OTHER): Payer: Medicaid Other | Admitting: Obstetrics and Gynecology

## 2021-11-21 ENCOUNTER — Encounter: Payer: Self-pay | Admitting: Obstetrics and Gynecology

## 2021-11-21 VITALS — BP 116/78 | HR 72 | Resp 16 | Ht 65.0 in | Wt 184.7 lb

## 2021-11-21 DIAGNOSIS — Z809 Family history of malignant neoplasm, unspecified: Secondary | ICD-10-CM | POA: Diagnosis not present

## 2021-11-21 DIAGNOSIS — Z308 Encounter for other contraceptive management: Secondary | ICD-10-CM

## 2021-12-19 ENCOUNTER — Encounter: Payer: Medicaid Other | Admitting: Certified Nurse Midwife

## 2021-12-21 ENCOUNTER — Encounter: Payer: Self-pay | Admitting: Obstetrics and Gynecology

## 2021-12-21 ENCOUNTER — Ambulatory Visit (INDEPENDENT_AMBULATORY_CARE_PROVIDER_SITE_OTHER): Payer: Medicaid Other | Admitting: Obstetrics and Gynecology

## 2021-12-21 VITALS — BP 113/79 | HR 69 | Ht 65.0 in | Wt 177.0 lb

## 2021-12-21 DIAGNOSIS — Z809 Family history of malignant neoplasm, unspecified: Secondary | ICD-10-CM

## 2021-12-21 DIAGNOSIS — Z01818 Encounter for other preprocedural examination: Secondary | ICD-10-CM

## 2021-12-21 DIAGNOSIS — Z3009 Encounter for other general counseling and advice on contraception: Secondary | ICD-10-CM

## 2021-12-21 NOTE — Progress Notes (Signed)
GYNECOLOGY PREOPERATIVE HISTORY AND PHYSICAL   Subjective:  Alexandria Gomez is a 26 y.o. G2P1011 here for surgical management of undesired fertility.   Also for further discussion of hereditary cancer screening due to family history of cancer.  Indications for procedure include: undesired fertility and family history of cancer. No significant preoperative concerns.  Proposed surgery: Bilateral tubal ligation   Pertinent Gynecological History: Menses: flow is moderate and regular every month without intermenstrual spotting Contraception: none.  Last pap: normal Date: 08/31/2020   Past Medical History:  Diagnosis Date   Anemia    Eczema    Migraines    PCOS (polycystic ovarian syndrome)     Past Surgical History:  Procedure Laterality Date   CHOLECYSTECTOMY N/A 04/20/2021   LAPAROSCOPIC CHOLECYSTECTOMY;  Surgeon: Dwan Bolt, MD;  Location: [facility]   wisdom teeth removal      OB History  Gravida Para Term Preterm AB Living  2 1 1   1 1   SAB IAB Ectopic Multiple Live Births  1     0 1    # Outcome Date GA Lbr Len/2nd Weight Sex Delivery Anes PTL Lv  2 Term 02/27/21 [redacted]w[redacted]d / 03:50 6 lb 10.5 oz (3.02 kg) M Vag-Spont EPI, Local  LIV  1 SAB             Family History  Problem Relation Age of Onset   Arthritis Mother    Cancer Mother        kidney    Diabetes Mother    Learning disabilities Mother    Miscarriages / [place] Mother    Allergic rhinitis Father    COPD Father    Heart attack Father 18   Allergic rhinitis Sister    Alcohol abuse Sister    Depression Sister    Kidney disease Sister    Breast cancer Maternal Aunt    Diabetes Maternal Grandmother    High Cholesterol Maternal Grandmother    Miscarriages / Stillbirths Maternal Grandmother    Diabetes Maternal Grandfather    High blood pressure Maternal Grandfather    Allergic rhinitis Nephew     Social History   Socioeconomic History   Marital status: Married    Spouse name: Not on file    Number of children: Not on file   Years of education: Not on file   Highest education level: Not on file  Occupational History   Not on file  Tobacco Use   Smoking status: Never   Smokeless tobacco: Never  Vaping Use   Vaping Use: Never used  Substance and Sexual Activity   Alcohol use: Not Currently   Drug use: Never   Sexual activity: Yes    Partners: Male    Birth control/protection: Implant  Other Topics Concern   Not on file  Social History Narrative   From [place]   Lives with husband Alexandria Gomez   Occ: Merchant navy officer at [employer]   Edu: [employer] at [employer]   Activity: no regular exercise   Diet: some water, fruits/vegetables daily   Social Determinants of Health   Financial Resource Strain: Not on file  Food Insecurity: Not on file  Transportation Needs: Not on file  Physical Activity: Not on file  Stress: Not on file  Social Connections: Not on file  Intimate Partner Violence: Not on file    Current Outpatient Medications on File Prior to Visit  Medication Sig Dispense Refill   cetirizine (ZYRTEC ALLERGY) 10  MG tablet Take 1 tablet (10 mg total) by mouth daily for 14 days. 14 tablet 0   EPINEPHrine 0.3 mg/0.3 mL IJ SOAJ injection Inject 0.3 mg into the muscle as needed for anaphylaxis. 1 each 0   No current facility-administered medications on file prior to visit.   No Known Allergies    Review of Systems Constitutional: No recent fever/chills/sweats Respiratory: No recent cough/bronchitis Cardiovascular: No chest pain Gastrointestinal: No recent nausea/vomiting/diarrhea Genitourinary: No UTI symptoms Hematologic/lymphatic:No history of coagulopathy or recent blood thinner use    Objective:   Blood pressure 113/79, pulse 69, height 5\' 5"  (1.651 m), weight 177 lb (80.3 kg), last menstrual period 12/12/2021, not currently breastfeeding. CONSTITUTIONAL: Well-developed, well-nourished female in no acute distress.   HENT:  Normocephalic, atraumatic, External right and left ear normal. Oropharynx is clear and moist EYES: Conjunctivae and EOM are normal. Pupils are equal, round, and reactive to light. No scleral icterus.  NECK: Normal range of motion, supple, no masses SKIN: Skin is warm and dry. No rash noted. Not diaphoretic. No erythema. No pallor. NEUROLOGIC: Alert and oriented to person, place, and time. Normal reflexes, muscle tone coordination. No cranial nerve deficit noted. PSYCHIATRIC: Normal mood and affect. Normal behavior. Normal judgment and thought content. CARDIOVASCULAR: Normal heart rate noted, regular rhythm RESPIRATORY: Effort and breath sounds normal, no problems with respiration noted ABDOMEN: Soft, nontender, nondistended. PELVIC: Deferred MUSCULOSKELETAL: Normal range of motion. No edema and no tenderness. 2+ distal pulses.    Labs: No results found for this or any previous visit (from the past 336 hour(s)).   Imaging Studies: No results found.  Assessment:    1. Unwanted fertility   2. Family history of cancer     Plan:   - Counseling: Patient desires permanent sterilization.  Other reversible forms of contraception were discussed with patient; she declines all other modalities. Risks of procedure discussed with patient including but not limited to: risk of regret, permanence of method, bleeding, infection, injury to surrounding organs and need for additional procedures.  Failure risk of about 1% with increased risk of ectopic gestation if pregnancy occurs was also discussed with patient.  Also discussed possibility of post-tubal pain syndrome. Patient verbalized understanding of these risks and wants to proceed with sterilization.  Written informed consent obtained.  To OR when ready. - Preop testing ordered. - Counseled on hereditary cancer screening.  Patient desires testing. [employer] panel ordered.  Instructions reviewed, including NPO after midnight.      Rubie Maid, [facility]

## 2021-12-23 NOTE — Patient Instructions (Signed)
GYNECOLOGY PRE-OPERATIVE INSTRUCTIONS  You are scheduled for surgery on ?Marland Kitchen  The name of your procedure is: Laparoscopic Bilateral Tubal Ligation.   Please read through these instructions carefully regarding preparation for your surgery: Nothing to eat after midnight on the day prior to surgery.  Do not take any medications unless recommended by your provider on day prior to surgery.  Do not take NSAIDs (Motrin, Aleve) or aspirin 7 days prior to surgery.  You may take Tylenol products for minor aches and pains.  You will receive a prescription for pain medications post-operatively.  You will be contacted by phone approximately 1-2 weeks prior to surgery to schedule your pre-operative appointment.  If you are being admitted to the hospital for an overnight stay, you will require COVID testing prior to surgery. Instructions will be given to you on when and where to have this performed.  Please call the office if you have any questions regarding your upcoming surgery.    Thank you for choosing [facility].

## 2021-12-25 NOTE — Progress Notes (Deleted)
Follow Up Note  RE: Alexandria Gomez MRN: [id number] DOB: 02-26-1996 Date of Office Visit: 12/26/2021  Referring provider: No ref. provider found Primary care provider: Pcp, No  Chief Complaint: No chief complaint on file.  History of Present Illness: I had the pleasure of seeing Alexandria Gomez for a follow up visit at the [facility] on 12/25/2021. She is a 26 y.o. female, who is being followed for allergic reactions, allergic rhinitis and globus sensation. Her previous allergy office visit was on 09/19/2021 with Dr. Maudie Mercury. Today is a regular follow up visit.  Allergic reaction 3 episodes of allergic reactions (mainly pruritus and hives) within 15 minutes of eating a meal. Each time she had a different meal with no common ingredients and she has eaten most of those ingredients since then with no reaction. Denies any changes in meds, personal care products or recent infections. No insect/bug bites. Today's skin prick testing showed: Positive to grass, mold. Borderline positive to tree and almond.  Based on clinical history and above test results not sure what exactly she had a reaction to. Avoid the 2 dishes that you had reactions to. Recommend to avoid soy and sesame as last 2 meals that she had a reaction to contained those top food allergens.  Epinephrine injectable device - demonstrated proper use. For mild symptoms you can take over the counter antihistamines such as Benadryl and monitor symptoms closely. If symptoms worsen or if you have severe symptoms including breathing issues, throat closure, significant swelling, whole body hives, severe diarrhea and vomiting, lightheadedness then inject epinephrine and seek immediate medical care afterwards. Emergency action plan given. Let me know if you have any other reactions.  Get bloodwork as below.    Other allergic rhinitis Mild symptoms in the spring. Takes zyrtec prn with good benefit. Today's skin prick  testing showed: Positive to grass, mold. Borderline positive to tree.  Start environmental control measures. Use over the counter antihistamines such as Zyrtec (cetirizine), Claritin (loratadine), Allegra (fexofenadine), or Xyzal (levocetirizine) daily as needed. May take twice a day during allergy flares. May switch antihistamines every few months.   Globus sensation Developed globus sensation after these episodes.  Start taking omeprazole 40mg  daily in the morning x 1 month. Nothing to eat or drink for 30 minutes afterwards.  If no improvement then recommend GI evaluation next.    Return in about 3 months (around 12/20/2021).  Bloodwork was only borderline positive to shrimp. Given your clinical history, I'm not sure what cause those 3 allergic reactions. If you have another reaction let us know. Avoid shrimp for now. Okay to eat all other foods. See you in October.  Assessment and Plan: Alexandria Gomez is a 26 y.o. female with: No problem-specific Assessment & Plan notes found for this encounter.  No follow-ups on file.  No orders of the defined types were placed in this encounter.  Lab Orders  No laboratory test(s) ordered today    Diagnostics: Spirometry:  Tracings reviewed. Her effort: {Blank single:19197::"Good reproducible efforts.","It was hard to get consistent efforts and there is a question as to whether this reflects a maximal maneuver.","Poor effort, data can not be interpreted."} FVC: ***L FEV1: ***L, ***% predicted FEV1/FVC ratio: ***% Interpretation: {Blank single:19197::"Spirometry consistent with mild obstructive disease","Spirometry consistent with moderate obstructive disease","Spirometry consistent with severe obstructive disease","Spirometry consistent with possible restrictive disease","Spirometry consistent with mixed obstructive and restrictive disease","Spirometry uninterpretable due to technique","Spirometry consistent with normal pattern","No overt  abnormalities noted given today's  efforts"}.  Please see scanned spirometry results for details.  Skin Testing: {Blank single:19197::"Select foods","Environmental allergy panel","Environmental allergy panel and select foods","Food allergy panel","None","Deferred due to recent antihistamines use"}. *** Results discussed with patient/family.   Medication List:  Current Outpatient Medications  Medication Sig Dispense Refill   cetirizine (ZYRTEC ALLERGY) 10 MG tablet Take 1 tablet (10 mg total) by mouth daily for 14 days. 14 tablet 0   EPINEPHrine 0.3 mg/0.3 mL IJ SOAJ injection Inject 0.3 mg into the muscle as needed for anaphylaxis. 1 each 0   No current facility-administered medications for this visit.   Allergies: No Known Allergies I reviewed her past medical history, social history, family history, and environmental history and no significant changes have been reported from her previous visit.  Review of Systems  Constitutional:  Negative for appetite change, chills, fever and unexpected weight change.  HENT:  Negative for congestion and rhinorrhea.        Globus sensation  Eyes:  Negative for itching.  Respiratory:  Negative for cough, chest tightness, shortness of breath and wheezing.   Cardiovascular:  Negative for chest pain.  Gastrointestinal:  Negative for abdominal pain.  Genitourinary:  Negative for difficulty urinating.  Skin:  Negative for rash.  Allergic/Immunologic: Positive for environmental allergies.  Neurological:  Negative for headaches.    Objective: LMP 12/12/2021 (Exact Date)  There is no height or weight on file to calculate BMI. Physical Exam Vitals and nursing note reviewed.  Constitutional:      Appearance: Normal appearance. She is well-developed.  HENT:     Head: Normocephalic and atraumatic.     Right Ear: Tympanic membrane and external ear normal.     Left Ear: Tympanic membrane and external ear normal.     Nose: Nose normal.      Mouth/Throat:     Mouth: Mucous membranes are moist.     Pharynx: Oropharynx is clear.  Eyes:     Conjunctiva/sclera: Conjunctivae normal.  Cardiovascular:     Rate and Rhythm: Normal rate and regular rhythm.     Heart sounds: Normal heart sounds. No murmur heard.    No friction rub. No gallop.  Pulmonary:     Effort: Pulmonary effort is normal.     Breath sounds: Normal breath sounds. No wheezing, rhonchi or rales.  Musculoskeletal:     Cervical back: Neck supple.  Skin:    General: Skin is warm.     Findings: No rash.  Neurological:     Mental Status: She is alert and oriented to person, place, and time.  Psychiatric:        Behavior: Behavior normal.    Previous notes and tests were reviewed. The plan was reviewed with the patient/family, and all questions/concerned were addressed.  It was my pleasure to see Alexandria Gomez today and participate in her care. Please feel free to contact me with any questions or concerns.  Sincerely,  Wyline Mood, DO Allergy & Immunology  [facility] office: [phone] [facility] office: [phone]

## 2021-12-26 ENCOUNTER — Ambulatory Visit: Payer: Medicaid Other | Admitting: Allergy

## 2021-12-26 DIAGNOSIS — R09A2 Foreign body sensation, throat: Secondary | ICD-10-CM

## 2021-12-26 DIAGNOSIS — J3089 Other allergic rhinitis: Secondary | ICD-10-CM

## 2021-12-26 DIAGNOSIS — T7840XD Allergy, unspecified, subsequent encounter: Secondary | ICD-10-CM

## 2022-01-02 ENCOUNTER — Encounter: Payer: Self-pay | Admitting: Obstetrics and Gynecology

## 2022-01-03 ENCOUNTER — Other Ambulatory Visit: Payer: Self-pay

## 2022-01-03 ENCOUNTER — Encounter
Admission: RE | Admit: 2022-01-03 | Discharge: 2022-01-03 | Disposition: A | Discharge status: Home / Self Care | Payer: Medicaid Other | Source: Ambulatory Visit | Attending: Obstetrics and Gynecology | Admitting: Obstetrics and Gynecology

## 2022-01-03 DIAGNOSIS — Z01812 Encounter for preprocedural laboratory examination: Secondary | ICD-10-CM

## 2022-01-03 HISTORY — DX: Anxiety disorder, unspecified: F41.9

## 2022-01-03 NOTE — Patient Instructions (Addendum)
Your procedure is scheduled on: 01/08/22 - Monday Report to the Registration Desk on the 1st floor of the [facility]. To find out your arrival time, please call [phone] between 1PM - 3PM on: 01/05/22 - Friday If your arrival time is 6:00 am, do not arrive prior to that time as the [facility] entrance doors do not open until 6:00 am.  REMEMBER: Instructions that are not followed completely may result in serious medical risk, up to and including death; or upon the discretion of your surgeon and anesthesiologist your surgery may need to be rescheduled.  Do not eat food or drink any fluids after midnight the night before surgery.  No gum chewing, lozengers or hard candies.   TAKE THESE MEDICATIONS THE MORNING OF SURGERY WITH A SIP OF WATER: NONE  One week prior to surgery: Stop Anti-inflammatories (NSAIDS) such as Advil, Aleve, Ibuprofen, Motrin, Naproxen, Naprosyn and Aspirin based products such as Excedrin, Goodys Powder, BC Powder.  Stop ANY OVER THE COUNTER supplements until after surgery.   You may however, continue to take Tylenol if needed for pain up until the day of surgery.  No Alcohol for 24 hours before or after surgery.  No Smoking including e-cigarettes for 24 hours prior to surgery.  No chewable tobacco products for at least 6 hours prior to surgery.  No nicotine patches on the day of surgery.  Do not use any "recreational" drugs for at least a week prior to your surgery.  Please be advised that the combination of cocaine and anesthesia may have negative outcomes, up to and including death. If you test positive for cocaine, your surgery will be cancelled.  On the morning of surgery brush your teeth with toothpaste and water, you may rinse your mouth with mouthwash if you wish. Do not swallow any toothpaste or mouthwash.  Do not wear jewelry, make-up, hairpins, clips or nail polish.  Do not wear lotions, powders, or perfumes.   Do not shave body from the  neck down 48 hours prior to surgery just in case you cut yourself which could leave a site for infection.  Also, freshly shaved skin may become irritated if using the CHG soap.  Contact lenses, hearing aids and dentures may not be worn into surgery.  Do not bring valuables to the hospital. [facility] is not responsible for any missing/lost belongings or valuables.   Notify your doctor if there is any change in your medical condition (cold, fever, infection).  Wear comfortable clothing (specific to your surgery type) to the hospital.  After surgery, you can help prevent lung complications by doing breathing exercises.  Take deep breaths and cough every 1-2 hours. Your doctor may order a device called an Incentive Spirometer to help you take deep breaths. When coughing or sneezing, hold a pillow firmly against your incision with both hands. This is called "splinting." Doing this helps protect your incision. It also decreases belly discomfort.  If you are being admitted to the hospital overnight, leave your suitcase in the car. After surgery it may be brought to your room.  If you are being discharged the day of surgery, you will not be allowed to drive home. You will need a responsible adult (18 years or older) to drive you home and stay with you that night.   If you are taking public transportation, you will need to have a responsible adult (18 years or older) with you. Please confirm with your physician that it is acceptable to use  public transportation.   Please call the Pre-admissions Testing Dept. at [phone] if you have any questions about these instructions.  Surgery Visitation Policy:  Patients undergoing a surgery or procedure may have two family members or support persons with them as long as the person is not COVID-19 positive or experiencing its symptoms.   Inpatient Visitation:    Visiting hours are 7 a.m. to 8 p.m. Up to four visitors are allowed at one time in a  patient room, including children. The visitors may rotate out with other people during the day. One designated support person (adult) may remain overnight.

## 2022-01-07 MED ORDER — CHLORHEXIDINE GLUCONATE 0.12 % MT SOLN: 15.0000 mL | Freq: Once | OROMUCOSAL | Status: AC

## 2022-01-07 MED ORDER — FAMOTIDINE 20 MG PO TABS: 20.0000 mg | ORAL_TABLET | Freq: Once | ORAL | Status: AC

## 2022-01-07 MED ORDER — LACTATED RINGERS IV SOLN: INTRAVENOUS | Status: DC

## 2022-01-07 MED ORDER — ORAL CARE MOUTH RINSE: 15.0000 mL | Freq: Once | OROMUCOSAL | Status: AC

## 2022-01-08 ENCOUNTER — Ambulatory Visit: Payer: Medicaid Other | Admitting: Anesthesiology

## 2022-01-08 ENCOUNTER — Encounter
Admission: RE | Disposition: A | Discharge status: Home / Self Care | Payer: Self-pay | Source: Home / Self Care | Attending: Obstetrics and Gynecology

## 2022-01-08 ENCOUNTER — Ambulatory Visit
Admission: RE | Admit: 2022-01-08 | Discharge: 2022-01-08 | Disposition: A | Discharge status: Home / Self Care | Payer: Medicaid Other | Attending: Obstetrics and Gynecology | Admitting: Obstetrics and Gynecology

## 2022-01-08 ENCOUNTER — Other Ambulatory Visit: Payer: Self-pay

## 2022-01-08 ENCOUNTER — Encounter: Payer: Self-pay | Admitting: Obstetrics and Gynecology

## 2022-01-08 DIAGNOSIS — Z809 Family history of malignant neoplasm, unspecified: Secondary | ICD-10-CM | POA: Insufficient documentation

## 2022-01-08 DIAGNOSIS — Z302 Encounter for sterilization: Secondary | ICD-10-CM | POA: Diagnosis present

## 2022-01-08 DIAGNOSIS — Z01812 Encounter for preprocedural laboratory examination: Secondary | ICD-10-CM

## 2022-01-08 DIAGNOSIS — Z01818 Encounter for other preprocedural examination: Secondary | ICD-10-CM

## 2022-01-08 DIAGNOSIS — Z3009 Encounter for other general counseling and advice on contraception: Secondary | ICD-10-CM

## 2022-01-08 HISTORY — PX: LAPAROSCOPIC TUBAL LIGATION: SHX1937

## 2022-01-08 LAB — CBC
HCT: 39 % (ref 36.0–46.0)
Hemoglobin: 13.3 g/dL (ref 12.0–15.0)
MCH: 29.4 pg (ref 26.0–34.0)
MCHC: 34.1 g/dL (ref 30.0–36.0)
MCV: 86.1 fL (ref 80.0–100.0)
Platelets: 232 10*3/uL (ref 150–400)
RBC: 4.53 MIL/uL (ref 3.87–5.11)
RDW: 13.3 % (ref 11.5–15.5)
WBC: 6.1 10*3/uL (ref 4.0–10.5)
nRBC: 0 % (ref 0.0–0.2)

## 2022-01-08 LAB — POCT PREGNANCY, URINE: Preg Test, Ur: NEGATIVE

## 2022-01-08 LAB — POTASSIUM: Potassium: 4.2 mmol/L (ref 3.5–5.1)

## 2022-01-08 SURGERY — LIGATION, FALLOPIAN TUBE, LAPAROSCOPIC
Anesthesia: General | Laterality: Bilateral

## 2022-01-08 MED ORDER — CELECOXIB 200 MG PO CAPS
ORAL_CAPSULE | ORAL | Status: AC
  Administered 2022-01-08: 400 mg via ORAL
  Filled 2022-01-08: qty 2

## 2022-01-08 MED ORDER — CHLORHEXIDINE GLUCONATE 0.12 % MT SOLN
OROMUCOSAL | Status: AC
  Administered 2022-01-08: 15 mL via OROMUCOSAL
  Filled 2022-01-08: qty 15

## 2022-01-08 MED ORDER — GABAPENTIN 300 MG PO CAPS
ORAL_CAPSULE | ORAL | Status: AC
  Administered 2022-01-08: 300 mg via ORAL
  Filled 2022-01-08: qty 1

## 2022-01-08 MED ORDER — FENTANYL CITRATE (PF) 100 MCG/2ML IJ SOLN
INTRAMUSCULAR | Status: AC
  Filled 2022-01-08: qty 2

## 2022-01-08 MED ORDER — GABAPENTIN 300 MG PO CAPS: 300.0000 mg | ORAL_CAPSULE | ORAL | Status: AC

## 2022-01-08 MED ORDER — DOCUSATE SODIUM 100 MG PO CAPS: 100.0000 mg | ORAL_CAPSULE | Freq: Two times a day (BID) | ORAL | 1 refills | Status: DC | PRN

## 2022-01-08 MED ORDER — FAMOTIDINE 20 MG PO TABS
ORAL_TABLET | ORAL | Status: AC
  Administered 2022-01-08: 20 mg via ORAL
  Filled 2022-01-08: qty 1

## 2022-01-08 MED ORDER — CELECOXIB 200 MG PO CAPS: 400.0000 mg | ORAL_CAPSULE | ORAL | Status: AC

## 2022-01-08 MED ORDER — BUPIVACAINE HCL 0.5 % IJ SOLN
INTRAMUSCULAR | Status: DC | PRN
  Administered 2022-01-08: 10 mL

## 2022-01-08 MED ORDER — MIDAZOLAM HCL 2 MG/2ML IJ SOLN
INTRAMUSCULAR | Status: DC | PRN
  Administered 2022-01-08: 2 mg via INTRAVENOUS

## 2022-01-08 MED ORDER — SUGAMMADEX SODIUM 200 MG/2ML IV SOLN
INTRAVENOUS | Status: DC | PRN
  Administered 2022-01-08: 200 mg via INTRAVENOUS

## 2022-01-08 MED ORDER — DEXAMETHASONE SODIUM PHOSPHATE 10 MG/ML IJ SOLN
INTRAMUSCULAR | Status: DC | PRN
  Administered 2022-01-08: 10 mg via INTRAVENOUS

## 2022-01-08 MED ORDER — DEXAMETHASONE SODIUM PHOSPHATE 10 MG/ML IJ SOLN
INTRAMUSCULAR | Status: AC
  Filled 2022-01-08: qty 1

## 2022-01-08 MED ORDER — PROPOFOL 10 MG/ML IV BOLUS
INTRAVENOUS | Status: DC | PRN
  Administered 2022-01-08: 150 mg via INTRAVENOUS

## 2022-01-08 MED ORDER — IBUPROFEN 800 MG PO TABS: 800.0000 mg | ORAL_TABLET | Freq: Three times a day (TID) | ORAL | 0 refills | Status: AC | PRN

## 2022-01-08 MED ORDER — HYDROMORPHONE HCL 1 MG/ML IJ SOLN: 0.2500 mg | INTRAMUSCULAR | Status: DC | PRN

## 2022-01-08 MED ORDER — LACTATED RINGERS IV SOLN: INTRAVENOUS | Status: DC

## 2022-01-08 MED ORDER — ROCURONIUM BROMIDE 10 MG/ML (PF) SYRINGE
PREFILLED_SYRINGE | INTRAVENOUS | Status: AC
  Filled 2022-01-08: qty 10

## 2022-01-08 MED ORDER — LIDOCAINE HCL (PF) 2 % IJ SOLN
INTRAMUSCULAR | Status: AC
  Filled 2022-01-08: qty 5

## 2022-01-08 MED ORDER — OXYCODONE HCL 5 MG PO TABS: 5.0000 mg | ORAL_TABLET | ORAL | 0 refills | Status: DC | PRN

## 2022-01-08 MED ORDER — BUPIVACAINE HCL (PF) 0.5 % IJ SOLN
INTRAMUSCULAR | Status: AC
  Filled 2022-01-08: qty 30

## 2022-01-08 MED ORDER — ACETAMINOPHEN 500 MG PO TABS
ORAL_TABLET | ORAL | Status: AC
  Administered 2022-01-08: 1000 mg via ORAL
  Filled 2022-01-08: qty 2

## 2022-01-08 MED ORDER — ONDANSETRON HCL 4 MG/2ML IJ SOLN
INTRAMUSCULAR | Status: AC
  Filled 2022-01-08: qty 2

## 2022-01-08 MED ORDER — ROCURONIUM BROMIDE 100 MG/10ML IV SOLN
INTRAVENOUS | Status: DC | PRN
  Administered 2022-01-08: 50 mg via INTRAVENOUS

## 2022-01-08 MED ORDER — IBUPROFEN 800 MG PO TABS: 800.0000 mg | ORAL_TABLET | Freq: Three times a day (TID) | ORAL | 0 refills | Status: DC | PRN

## 2022-01-08 MED ORDER — LIDOCAINE HCL (CARDIAC) PF 100 MG/5ML IV SOSY
PREFILLED_SYRINGE | INTRAVENOUS | Status: DC | PRN
  Administered 2022-01-08: 100 mg via INTRAVENOUS

## 2022-01-08 MED ORDER — FENTANYL CITRATE (PF) 100 MCG/2ML IJ SOLN
INTRAMUSCULAR | Status: DC | PRN
  Administered 2022-01-08 (×2): 50 ug via INTRAVENOUS

## 2022-01-08 MED ORDER — ACETAMINOPHEN 500 MG PO TABS: 1000.0000 mg | ORAL_TABLET | ORAL | Status: AC

## 2022-01-08 MED ORDER — SIMETHICONE 80 MG PO CHEW: 80.0000 mg | CHEWABLE_TABLET | Freq: Four times a day (QID) | ORAL | 0 refills | Status: DC | PRN

## 2022-01-08 MED ORDER — DEXMEDETOMIDINE HCL IN NACL 80 MCG/20ML IV SOLN
INTRAVENOUS | Status: DC | PRN
  Administered 2022-01-08 (×4): 4 ug via INTRAVENOUS

## 2022-01-08 MED ORDER — OXYCODONE HCL 5 MG/5ML PO SOLN: 5.0000 mg | Freq: Once | ORAL | Status: DC | PRN

## 2022-01-08 MED ORDER — OXYCODONE HCL 5 MG PO TABS: 5.0000 mg | ORAL_TABLET | Freq: Once | ORAL | Status: DC | PRN

## 2022-01-08 MED ORDER — ONDANSETRON HCL 4 MG/2ML IJ SOLN
INTRAMUSCULAR | Status: DC | PRN
  Administered 2022-01-08: 4 mg via INTRAVENOUS

## 2022-01-08 SURGICAL SUPPLY — 32 items
ADH SKN CLS APL DERMABOND .7 (GAUZE/BANDAGES/DRESSINGS) ×1
APL PRP STRL LF DISP 70% ISPRP (MISCELLANEOUS) ×1
BLADE CLIPPER SURG (BLADE) IMPLANT
BLADE SURG SZ11 CARB STEEL (BLADE) ×1 IMPLANT
CATH ROBINSON RED A/P 16FR (CATHETERS) ×1 IMPLANT
CHLORAPREP W/TINT 26 (MISCELLANEOUS) ×1 IMPLANT
DERMABOND ADVANCED .7 DNX12 (GAUZE/BANDAGES/DRESSINGS) ×1 IMPLANT
GAUZE 4X4 16PLY ~~LOC~~+RFID DBL (SPONGE) ×1 IMPLANT
GLOVE BIO SURGEON STRL SZ 6.5 (GLOVE) ×1 IMPLANT
GLOVE SURG UNDER LTX SZ7 (GLOVE) ×2 IMPLANT
GOWN STRL REUS W/ TWL LRG LVL3 (GOWN DISPOSABLE) ×2 IMPLANT
GOWN STRL REUS W/TWL LRG LVL3 (GOWN DISPOSABLE) ×2
KIT PINK PAD W/HEAD ARE REST (MISCELLANEOUS) ×1
KIT PINK PAD W/HEAD ARM REST (MISCELLANEOUS) ×1 IMPLANT
KIT TURNOVER CYSTO (KITS) ×1 IMPLANT
LABEL OR SOLS (LABEL) ×1 IMPLANT
MANIFOLD NEPTUNE II (INSTRUMENTS) ×1 IMPLANT
NS IRRIG 500ML POUR BTL (IV SOLUTION) ×1 IMPLANT
PACK GYN LAPAROSCOPIC (MISCELLANEOUS) ×1 IMPLANT
PAD OB MATERNITY 4.3X12.25 (PERSONAL CARE ITEMS) ×1 IMPLANT
PAD PREP 24X41 OB/GYN DISP (PERSONAL CARE ITEMS) ×1 IMPLANT
SCRUB CHG 4% DYNA-HEX 4OZ (MISCELLANEOUS) ×1 IMPLANT
SET TUBE SMOKE EVAC HIGH FLOW (TUBING) ×1 IMPLANT
SHEARS ENDO 5MM LNG  ASK BEFOR (MISCELLANEOUS) ×1
SHEARS ENDO 5MM LNG ASK BEFOR (MISCELLANEOUS) ×1 IMPLANT
SUT VIC AB 4-0 SH 27 (SUTURE) ×1
SUT VIC AB 4-0 SH 27XANBCTRL (SUTURE) ×1 IMPLANT
SUT VICRYL 0 UR6 27IN ABS (SUTURE) ×1 IMPLANT
TRAP FLUID SMOKE EVACUATOR (MISCELLANEOUS) ×1 IMPLANT
TROCAR XCEL 12X100 BLDLESS (ENDOMECHANICALS) ×1 IMPLANT
TROCAR XCEL NON-BLD 5MMX100MML (ENDOMECHANICALS) IMPLANT
WATER STERILE IRR 500ML POUR (IV SOLUTION) ×1 IMPLANT

## 2022-01-08 NOTE — Op Note (Signed)
Procedure(s): LAPAROSCOPIC BILATERAL TUBAL LIGATION Procedure Note  Alexandria Gomez female 26 y.o. 01/08/2022  Indications: The patient is a 26 y.o. G17P1011 female with undesired fertility.   Pre-operative Diagnosis: Multiparous, Undesired fertility  Post-operative Diagnosis: Same  Surgeon: Hildred Laser, MD  Assistants:  None.   Anesthesia: General endotracheal anesthesia  Findings: The uterus was sounded to 8 cm. Fallopian tubes and ovaries appeared normal.  Procedure Details: The patient was seen in the Holding Room. The risks, benefits, complications, treatment options, and expected outcomes were discussed with the patient.  The patient concurred with the proposed plan, giving informed consent.  The site of surgery properly noted/marked. The patient was taken to the Operating Room, identified as [name] and the procedure verified as Procedure(s) (LRB): LAPAROSCOPIC BILATERAL TUBAL LIGATION (Bilateral). A Time Out was held and the above information confirmed.  She was then placed under general anesthesia without difficulty. She was placed in the dorsal lithotomy position, and was prepped and draped in a sterile manner.  A straight catheterization was performed. A sterile speculum was inserted into the vagina and the cervix was grasped at the anterior lip using a single-toothed tenaculum.  The uterus was sounded to 8 cm, and a Hulka clamp was placed for uterine manipulation.  The speculum and tenaculum were then removed. After an adequate timeout was performed, attention was turned to the abdomen where an umbilical incision was made with the scalpel.  The Optiview 5-mm trocar and sleeve were then advanced without difficulty with the laparoscope under direct visualization into the abdomen.  The abdomen was then insufflated with carbon dioxide gas and adequate pneumoperitoneum was obtained. A 5-mm right lower quadrant port was then placed under direct visualization.  A  survey of the patient's pelvis and abdomen revealed entirely normal anatomy.   The fallopian tubes were observed and found to be normal in appearance. Bipolar forceps was then advanced through the operative port and used to coagulate a 3 cm portion of the left tube in the mid isthmic area.  Good blanching and coagulation was noted at the site of the application.  There was no bleeding noted in the mesosalpinx.  A similar process was carried out on the right fallopian tube allowing for bilateral tubal sterilization.  Both cauterized areas were incised with laparoscopic scissors. Good hemostasis was noted overall.  The instruments were then removed from the patient's abdomen.  All skin incisions were closed with 4-0 Monocryl subcuticular stitches. The incisions were injected with a total of 10 ml 0.5% Sensorcaine. The incisions were covered with Dermabond. The uterine manipulator was removed from the vagina without complications.  The patient tolerated the procedure well.  Sponge, lap, and needle counts were correct times two.  The patient was then taken to the recovery room awake, extubated and in stable condition.   Estimated Blood Loss:  minimal      Drains: straight catheterization prior to procedure with 100 ml of clear urine         Total IV Fluids:  800 ml  Specimens: None         Implants: None         Complications:  None; patient tolerated the procedure well.         Disposition: PACU - hemodynamically stable.         Condition: stable   Hildred Laser, MD [facility]

## 2022-01-08 NOTE — H&P (Signed)
GYNECOLOGY PREOPERATIVE HISTORY AND PHYSICAL   Subjective:  Alexandria Gomez is a 26 y.o. G2P1011 here for surgical management of undesired fertility.   Also for further discussion of hereditary cancer screening due to family history of cancer.  Indications for procedure include: undesired fertility and family history of cancer. No significant preoperative concerns.  Proposed surgery: Bilateral tubal ligation   Pertinent Gynecological History: Menses: flow is moderate and regular every month without intermenstrual spotting Contraception: none.  Last pap: normal Date: 08/31/2020   Past Medical History:  Diagnosis Date   Anemia    Anxiety    Eczema    Migraines    PCOS (polycystic ovarian syndrome)     Past Surgical History:  Procedure Laterality Date   CHOLECYSTECTOMY N/A 04/20/2021   LAPAROSCOPIC CHOLECYSTECTOMY;  Surgeon: Fritzi Mandes, MD;  Location: [facility]   wisdom teeth removal      OB History  Gravida Para Term Preterm AB Living  2 1 1   1 1   SAB IAB Ectopic Multiple Live Births  1     0 1    # Outcome Date GA Lbr Len/2nd Weight Sex Delivery Anes PTL Lv  2 Term 02/27/21 [redacted]w[redacted]d / 03:50 3020 g M Vag-Spont EPI, Local  LIV  1 SAB             Family History  Problem Relation Age of Onset   Arthritis Mother    Cancer Mother        kidney    Diabetes Mother    Learning disabilities Mother    Miscarriages / [redacted]w[redacted]d Mother    Allergic rhinitis Father    COPD Father    Heart attack Father 37   Allergic rhinitis Sister    Alcohol abuse Sister    Depression Sister    Kidney disease Sister    Breast cancer Maternal Aunt    Diabetes Maternal Grandmother    High Cholesterol Maternal Grandmother    Miscarriages / Stillbirths Maternal Grandmother    Diabetes Maternal Grandfather    High blood pressure Maternal Grandfather    Allergic rhinitis Nephew     Social History   Socioeconomic History   Marital status: Married    Spouse name: 20   Number  of children: 3   Years of education: Not on file   Highest education level: Not on file  Occupational History   Not on file  Tobacco Use   Smoking status: Never   Smokeless tobacco: Never  Vaping Use   Vaping Use: Never used  Substance and Sexual Activity   Alcohol use: Yes    Comment: rarely   Drug use: Never   Sexual activity: Yes    Partners: Male    Birth control/protection: Implant  Other Topics Concern   Not on file  Social History Narrative   From [place]   Lives with husband [place]   Occ: [employer] at [employer]   Edu: 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 [employer]   Activity: no regular exercise   Diet: some water, fruits/vegetables daily   Social Determinants of Health   Financial Resource Strain: Not on file  Food Insecurity: Not on file  Transportation Needs: Not on file  Physical Activity: Not on file  Stress: Not on file  Social Connections: Not on file  Intimate Partner Violence: Not on file    No current facility-administered medications on file prior to encounter.   Current Outpatient Medications on File Prior to Encounter  Medication Sig Dispense Refill   cetirizine (ZYRTEC ALLERGY) 10 MG tablet Take 1 tablet (10 mg total) by mouth daily for 14 days. 14 tablet 0   EPINEPHrine 0.3 mg/0.3 mL IJ SOAJ injection Inject 0.3 mg into the muscle as needed for anaphylaxis. 1 each 0   Allergies  Allergen Reactions   Almond (Diagnostic)    Shrimp (Diagnostic)       Review of Systems Constitutional: No recent fever/chills/sweats Respiratory: No recent cough/bronchitis Cardiovascular: No chest pain Gastrointestinal: No recent nausea/vomiting/diarrhea Genitourinary: No UTI symptoms Hematologic/lymphatic:No history of coagulopathy or recent blood thinner use    Objective:   Blood pressure 108/74, pulse 74, temperature 97.9 F (36.6 C), temperature source Temporal, resp. rate 16, height 5\' 5"  (1.651 m), weight 80.3 kg, last menstrual  period 12/12/2021, SpO2 99 %, not currently breastfeeding. CONSTITUTIONAL: Well-developed, well-nourished female in no acute distress.  HENT:  Normocephalic, atraumatic, External right and left ear normal. Oropharynx is clear and moist EYES: Conjunctivae and EOM are normal. Pupils are equal, round, and reactive to light. No scleral icterus.  NECK: Normal range of motion, supple, no masses SKIN: Skin is warm and dry. No rash noted. Not diaphoretic. No erythema. No pallor. NEUROLOGIC: Alert and oriented to person, place, and time. Normal reflexes, muscle tone coordination. No cranial nerve deficit noted. PSYCHIATRIC: Normal mood and affect. Normal behavior. Normal judgment and thought content. CARDIOVASCULAR: Normal heart rate noted, regular rhythm RESPIRATORY: Effort and breath sounds normal, no problems with respiration noted ABDOMEN: Soft, nontender, nondistended. PELVIC: Deferred MUSCULOSKELETAL: Normal range of motion. No edema and no tenderness. 2+ distal pulses.    Labs: Results for orders placed or performed during the hospital encounter of 01/08/22 (from the past 336 hour(s))  Potassium   Collection Time: 01/08/22 12:09 PM  Result Value Ref Range   Potassium 4.2 3.5 - 5.1 mmol/L  CBC   Collection Time: 01/08/22 12:09 PM  Result Value Ref Range   WBC 6.1 4.0 - 10.5 K/uL   RBC 4.53 3.87 - 5.11 MIL/uL   Hemoglobin 13.3 12.0 - 15.0 g/dL   HCT 01/10/22 95.6 - 38.7 %   MCV 86.1 80.0 - 100.0 fL   MCH 29.4 26.0 - 34.0 pg   MCHC 34.1 30.0 - 36.0 g/dL   RDW 56.4 33.2 - 95.1 %   Platelets 232 150 - 400 K/uL   nRBC 0.0 0.0 - 0.2 %  Pregnancy, urine POC   Collection Time: 01/08/22 12:58 PM  Result Value Ref Range   Preg Test, Ur NEGATIVE NEGATIVE     Imaging Studies: No results found.  Assessment:    Undesired fertility Family history of cancer  Plan:   - Counseling: Patient desires permanent sterilization.  Other reversible forms of contraception were discussed with  patient; she declines all other modalities. Risks of procedure discussed with patient including but not limited to: risk of regret, permanence of method, bleeding, infection, injury to surrounding organs and need for additional procedures.  Failure risk of about 1% with increased risk of ectopic gestation if pregnancy occurs was also discussed with patient.  Also discussed possibility of post-tubal pain syndrome. Patient verbalized understanding of these risks and wants to proceed with sterilization.  Written informed consent obtained.  To OR when ready. - Preop testing reviewed. - Previously counseled on hereditary cancer screening. [facility] testing pending. Unknown currently if risk of ovarian cancer present. Patient counseled on possible need for salpingectomy in the future if testing is positive. Patient notes understanding and  is ok pwith plan.  Instructions reviewed, including NPO after midnight.      Hildred Laser, MD [facility]

## 2022-01-08 NOTE — Anesthesia Postprocedure Evaluation (Signed)
Anesthesia Post Note  Patient: Alexandria Gomez  Procedure(s) Performed: LAPAROSCOPIC BILATERAL TUBAL LIGATION (Bilateral)  Patient location during evaluation: PACU Anesthesia Type: General Level of consciousness: awake and alert Pain management: pain level controlled Vital Signs Assessment: post-procedure vital signs reviewed and stable Respiratory status: spontaneous breathing, nonlabored ventilation and respiratory function stable Cardiovascular status: blood pressure returned to baseline and stable Postop Assessment: no apparent nausea or vomiting Anesthetic complications: no   No notable events documented.   Last Vitals:  Vitals:   01/08/22 1755 01/08/22 1800  BP:  114/83  Pulse: 65 68  Resp:  15  Temp:  (!) 36.1 C  SpO2: 100% 100%    Last Pain:  Vitals:   01/08/22 1800  TempSrc: Temporal  PainSc: 0-No pain                 Foye Deer

## 2022-01-08 NOTE — Discharge Instructions (Signed)

## 2022-01-08 NOTE — Transfer of Care (Signed)
Immediate Anesthesia Transfer of Care Note  Patient: Alexandria Gomez  Procedure(s) Performed: LAPAROSCOPIC BILATERAL TUBAL LIGATION (Bilateral)  Patient Location: PACU  Anesthesia Type:General  Level of Consciousness: drowsy  Airway & Oxygen Therapy: Patient Spontanous Breathing and Patient connected to face mask oxygen  Post-op Assessment: Report given to RN and Post -op Vital signs reviewed and stable  Post vital signs: Reviewed and stable  Last Vitals:  Vitals Value Taken Time  BP 118/92 01/08/22 1712  Temp 36 C 01/08/22 1712  Pulse 90 01/08/22 1712  Resp 17 01/08/22 1714  SpO2 100 % 01/08/22 1712  Vitals shown include unvalidated device data.  Last Pain:  Vitals:   01/08/22 1208  TempSrc: Temporal  PainSc: 0-No pain         Complications: No notable events documented.

## 2022-01-08 NOTE — Anesthesia Preprocedure Evaluation (Signed)
Anesthesia Evaluation  Patient identified by MRN, date of birth, ID band Patient awake    Reviewed: Allergy & Precautions, NPO status , Patient's Chart, lab work & pertinent test results  History of Anesthesia Complications Negative for: history of anesthetic complications  Airway Mallampati: II  TM Distance: >3 FB Neck ROM: full    Dental  (+) Teeth Intact   Pulmonary neg pulmonary ROS   Pulmonary exam normal        Cardiovascular negative cardio ROS Normal cardiovascular exam     Neuro/Psych  Headaches  negative psych ROS   GI/Hepatic negative GI ROS, Neg liver ROS,,,  Endo/Other  negative endocrine ROS    Renal/GU      Musculoskeletal   Abdominal   Peds  Hematology negative hematology ROS (+)   Anesthesia Other Findings Past Medical History: No date: Anemia No date: Anxiety No date: Eczema No date: Migraines No date: PCOS (polycystic ovarian syndrome)  Past Surgical History: 04/20/2021: CHOLECYSTECTOMY; N/A     Comment:  LAPAROSCOPIC CHOLECYSTECTOMY;  Surgeon: Fritzi Mandes,              MD;  Location: [facility] No date: wisdom teeth removal     Reproductive/Obstetrics negative OB ROS                             Anesthesia Physical Anesthesia Plan  ASA: 2  Anesthesia Plan: General ETT   Post-op Pain Management: Toradol IV (intra-op)*, Ofirmev IV (intra-op)* and Dilaudid IV   Induction: Intravenous  PONV Risk Score and Plan: Ondansetron, Dexamethasone, Midazolam and Treatment may vary due to age or medical condition  Airway Management Planned: Oral ETT  Additional Equipment:   Intra-op Plan:   Post-operative Plan: Extubation in OR  Informed Consent: I have reviewed the patients History and Physical, chart, labs and discussed the procedure including the risks, benefits and alternatives for the proposed anesthesia with the patient or authorized representative who  has indicated his/her understanding and acceptance.     Dental Advisory Given  Plan Discussed with: Anesthesiologist, CRNA and Surgeon  Anesthesia Plan Comments: (Patient consented for risks of anesthesia including but not limited to:  - adverse reactions to medications - damage to eyes, teeth, lips or other oral mucosa - nerve damage due to positioning  - sore throat or hoarseness - Damage to heart, brain, nerves, lungs, other parts of body or loss of life  Patient voiced understanding.)       Anesthesia Quick Evaluation

## 2022-01-08 NOTE — Anesthesia Procedure Notes (Signed)
Procedure Name: Intubation Date/Time: 01/08/2022 4:01 PM  Performed by: Joanette Gula, Melessa Cowell, CRNAPre-anesthesia Checklist: Patient identified, Emergency Drugs available, Suction available and Patient being monitored Patient Re-evaluated:Patient Re-evaluated prior to induction Oxygen Delivery Method: Circle system utilized Preoxygenation: Pre-oxygenation with 100% oxygen Induction Type: IV induction Ventilation: Mask ventilation without difficulty Laryngoscope Size: McGraph and 3 Grade View: Grade I Tube type: Oral Tube size: 6.5 mm Number of attempts: 1 Airway Equipment and Method: Stylet Placement Confirmation: ETT inserted through vocal cords under direct vision, positive ETCO2 and breath sounds checked- equal and bilateral Secured at: 19 cm Tube secured with: Tape Dental Injury: Teeth and Oropharynx as per pre-operative assessment

## 2022-01-09 ENCOUNTER — Encounter: Payer: Self-pay | Admitting: Obstetrics and Gynecology

## 2022-01-15 ENCOUNTER — Encounter: Payer: Self-pay | Admitting: Obstetrics and Gynecology

## 2022-01-15 NOTE — Telephone Encounter (Signed)
Please see if patient can come into the office tomorrow if she is still feeling bad today.

## 2022-01-15 NOTE — Telephone Encounter (Signed)
Patient had BTL on 01/08/2022.  I called patient, vomiting has resolved. She does have some abdominal pain and nausea. Pain is 5/10 and she has not taken any medications to control the pain.  She is unsure if she has fever, but does note some chills yesterday. Bowel movements are normal. Incision site is normal. I recommended her to take Tylenol or Ibuprofen to control the pain. Try to drink ginger ale or anything with ginger to help with nausea. If symptoms fail to improve or worsen, please call us back. She verbalized understanding.

## 2022-01-16 ENCOUNTER — Encounter: Payer: Medicaid Other | Admitting: Obstetrics and Gynecology

## 2022-01-16 NOTE — Telephone Encounter (Signed)
Ok, thank you. Just wanted to make sure it was not related to her recent surgery.

## 2022-01-16 NOTE — Telephone Encounter (Signed)
Called patient. She does not need to come in. She said that she thinks it is something viral going around in her house, others are sick.

## 2022-01-22 NOTE — Progress Notes (Unsigned)
    OBSTETRICS/GYNECOLOGY POST-OPERATIVE CLINIC VISIT  Subjective:    Alexandria Gomez is a 26 y.o. female who presents to the clinic 2 weeks status post LAPAROSCOPIC BILATERAL TUBAL LIGATION  for undesired fertility . Eating a regular diet {with-without:5700} difficulty. Bowel movements are {normal/abnormal***:19619}. {pain control:13522::"The patient is not having any pain."}  {Common ambulatory SmartLinks:19316}  Review of Systems {ros; complete:30496}   Objective:   LMP 12/12/2021 (Exact Date) Comment: urine preg neg 11/13 There is no height or weight on file to calculate BMI.  General:  alert and no distress  Abdomen: soft, bowel sounds active, non-tender  Incision:   {incision:13716::"no dehiscence","incision well approximated","healing well","no drainage","no erythema","no hernia","no seroma","no swelling"}    Pathology:    Assessment:   Patient s/p *** (surgery)  {doing well:13525::"Doing well postoperatively."}   Plan:   1. Continue any current medications as instructed by provider. 2. Wound care discussed. 3. Operative findings again reviewed. Pathology report discussed. 4. Activity restrictions: {restrictions:13723} 5. Anticipated return to work: {work return:14002}. 6. Follow up: {7-41:28786} {time; units:18646} for ***    Hildred Laser, MD [facility]

## 2022-01-23 ENCOUNTER — Encounter: Payer: Self-pay | Admitting: Obstetrics and Gynecology

## 2022-01-23 ENCOUNTER — Ambulatory Visit (INDEPENDENT_AMBULATORY_CARE_PROVIDER_SITE_OTHER): Payer: Medicaid Other | Admitting: Obstetrics and Gynecology

## 2022-01-23 VITALS — BP 119/78 | HR 83 | Resp 16 | Ht 65.0 in | Wt 184.1 lb

## 2022-01-23 DIAGNOSIS — Z4889 Encounter for other specified surgical aftercare: Secondary | ICD-10-CM

## 2022-01-23 DIAGNOSIS — Z9851 Tubal ligation status: Secondary | ICD-10-CM

## 2022-09-19 IMAGING — US US OB FOLLOW-UP
1 series · 14 of 28 positions shown · non-contrast
Comparison: none

CLINICAL DATA: Size greater than dates

EXAM:
OBSTETRIC 14+ WK ULTRASOUND FOLLOW-UP

[Series 1: us ob follow-up · 0.41mm/px · 14 of 58 slices shown]
[im 3/58]
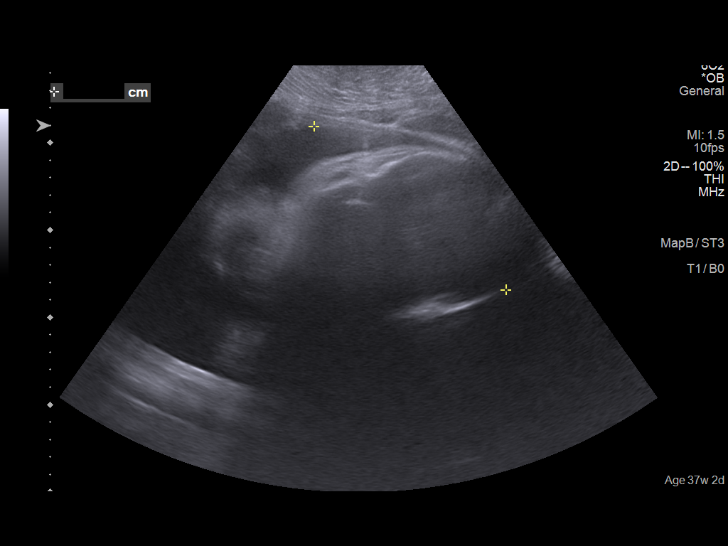
[im 7/58]
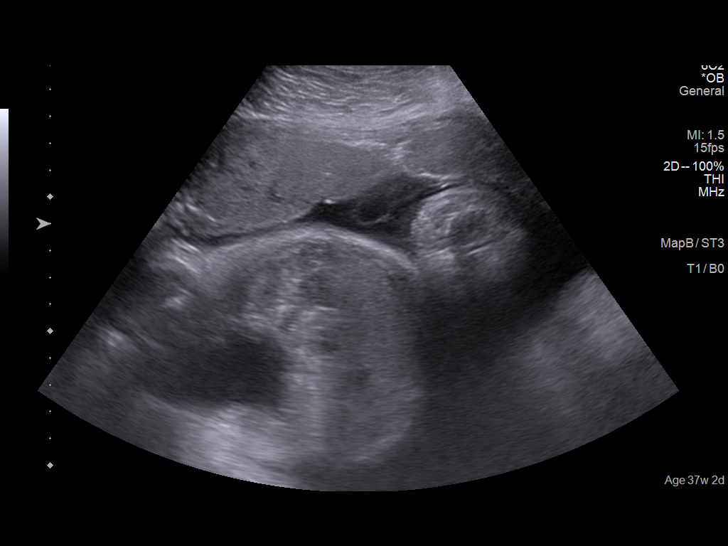
[im 11/58]
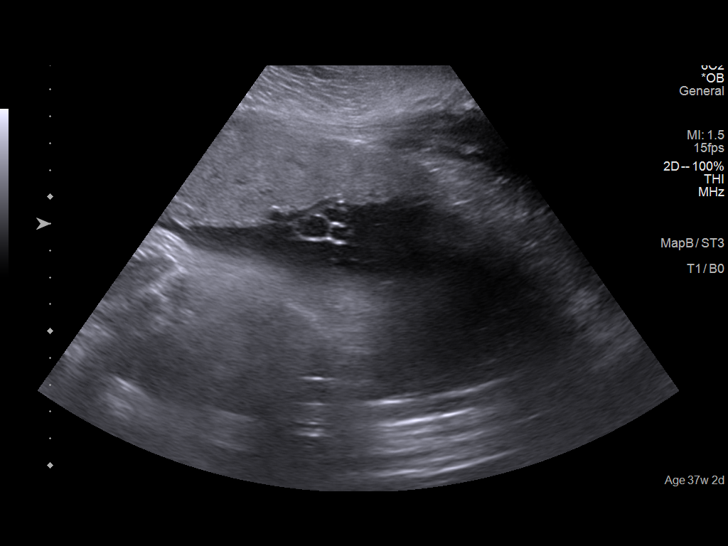
[im 15/58]
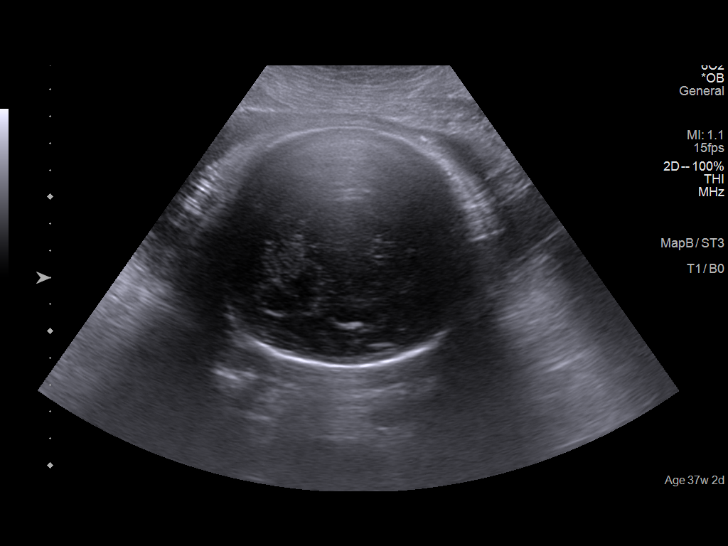
[im 20/58]
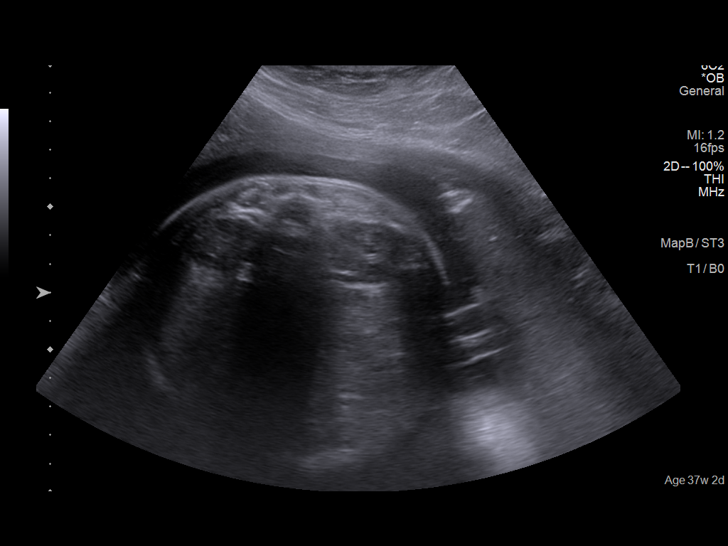
[im 24/58]
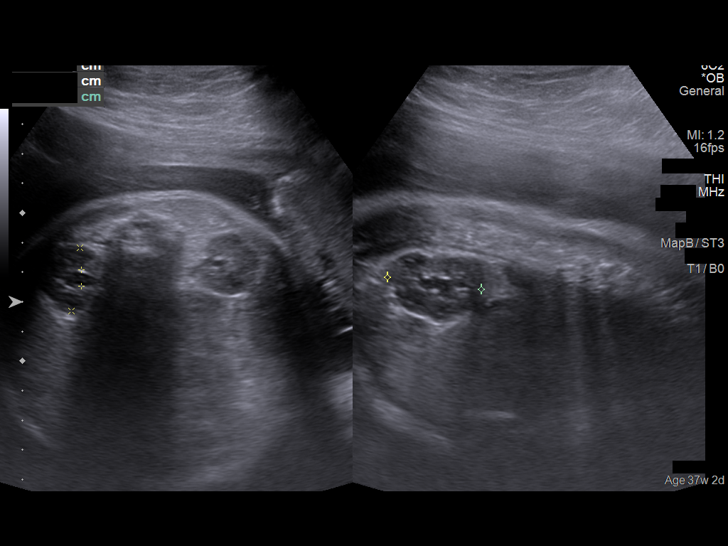
[im 28/58]
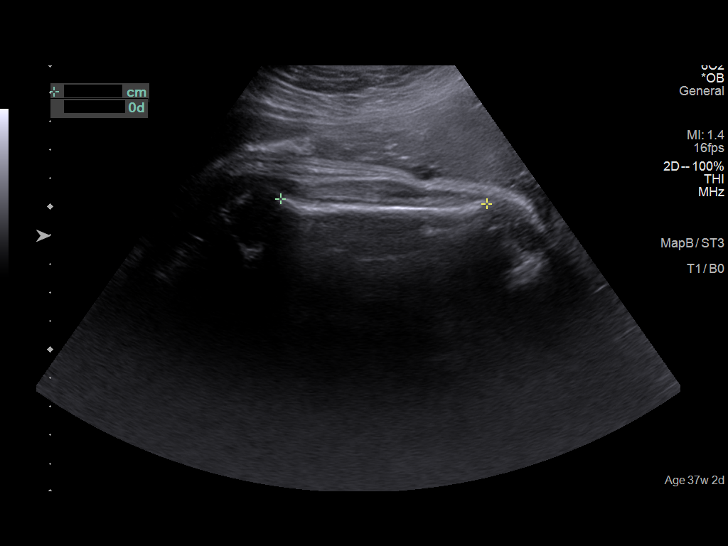
[im 32/58]
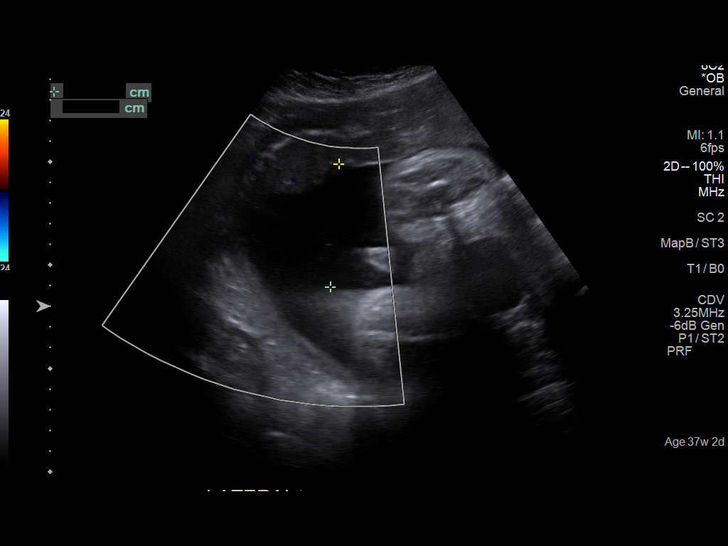
[im 36/58]
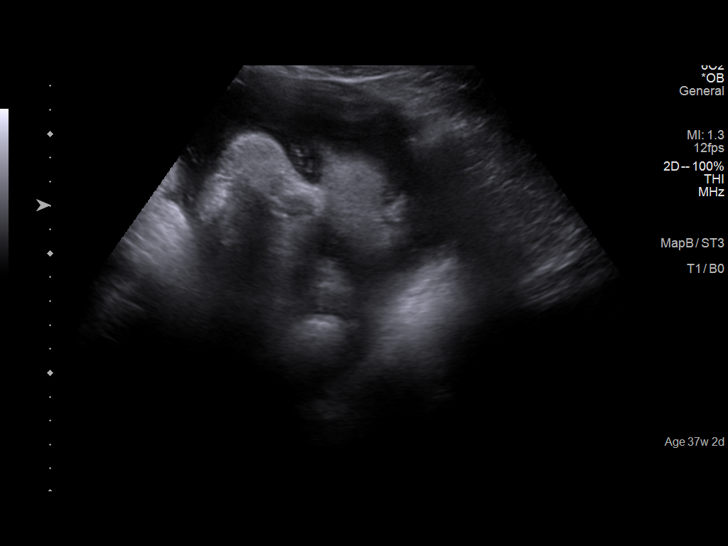
[im 41/58]
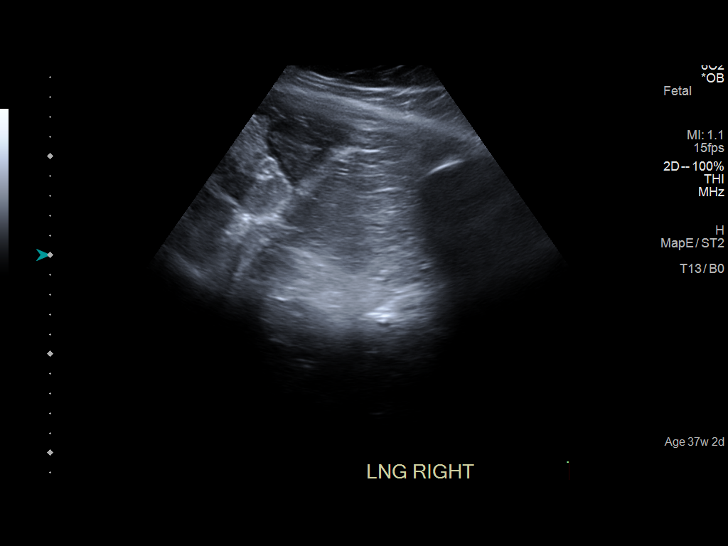
[im 45/58]
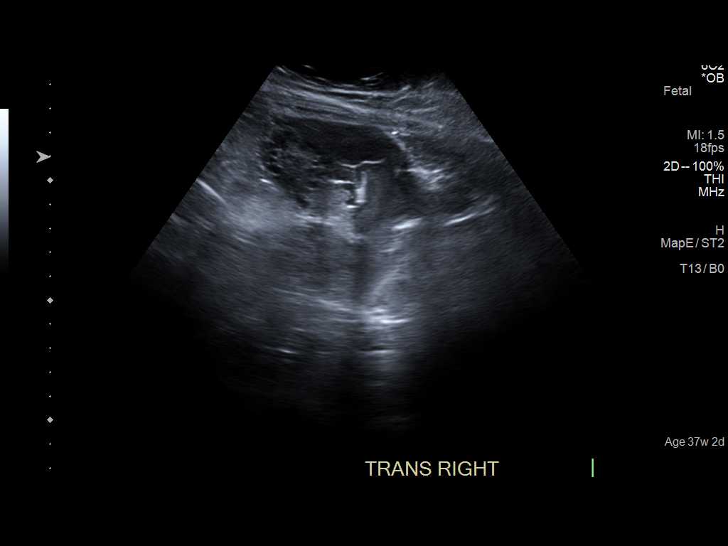
[im 49/58]
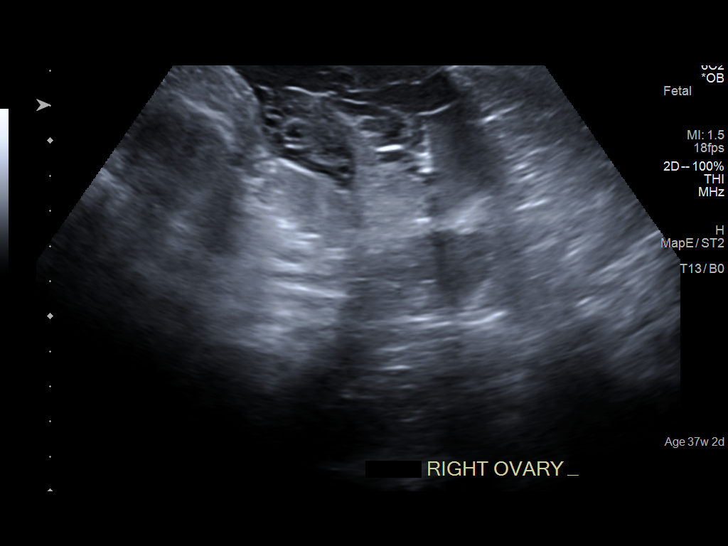
[im 53/58]
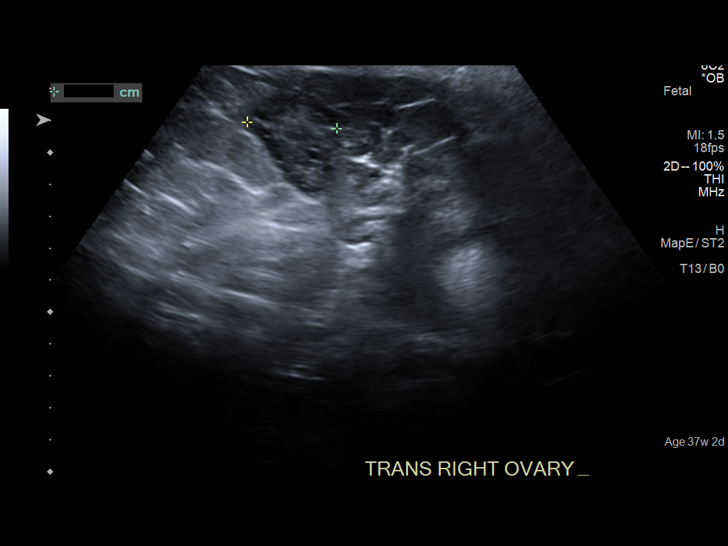
[im 58/58]
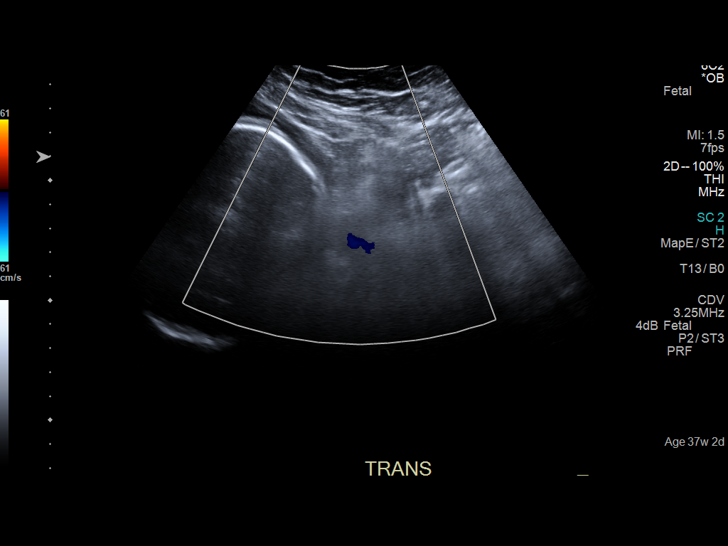

[14 of 28 positions shown; findings below may reference images not displayed]

FINDINGS: Number of Fetuses: 1

Heart Rate:  139 bpm

Movement: Yes

Presentation: Cephalic

Previa: No

Placental Location: Anterior

Amniotic Fluid (Subjective): Normal

Amniotic Fluid (Objective):

AFI 20.3 cm

FETAL BIOMETRY

BPD:  9.0cm 36w 2d

HC:    32.9cm 37w 3d

AC:    34.8cm 38w 5d

FL:    7.3cm 37w 1d

Current Mean GA: 37w 1d US EDC: 03/14/2021

Assigned GA: 37w 2d Assigned EDC: 03/13/2021

Estimated Fetal Weight:  3,348g 75%ile

FETAL ANATOMY

Previously completed.

Technical Limitations: None

Maternal Findings:

Cervix:  Obscured.
IMPRESSION: 1. Single live intrauterine gestation in cephalic presentation.
2. Assigned gestational age of 37 weeks 2 days. Adequate interval
growth.
3. Estimated fetal weight is in the 75th percentile.
4. Amniotic fluid index of 20.3 cm, within normal limits.

## 2022-10-10 IMAGING — CT CT ABD-PELV W/ CM
2 of 4 series · 16 of 46 positions shown, 18 images · IV contrast (agent unspecified)
Comparison: None.

CLINICAL DATA: Nausea and vomiting.  Right upper quadrant pain.

EXAM:
CT ABDOMEN AND PELVIS WITH CONTRAST
TECHNIQUE: Multidetector CT imaging of the abdomen and pelvis was performed
using the standard protocol following bolus administration of
intravenous contrast.

[Series 3: a/p w/ 5mm · axial · 0.88mm/px · z∈[+836,+1256]mm · 13 of 92 slices shown, 15 images]
[im 4/92  soft-tissue]
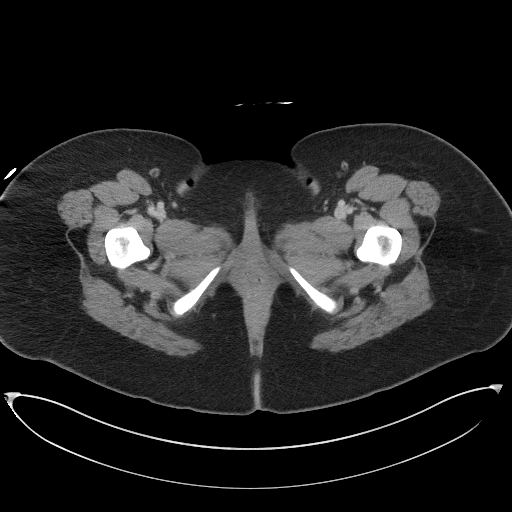
[im 4/92  bone]
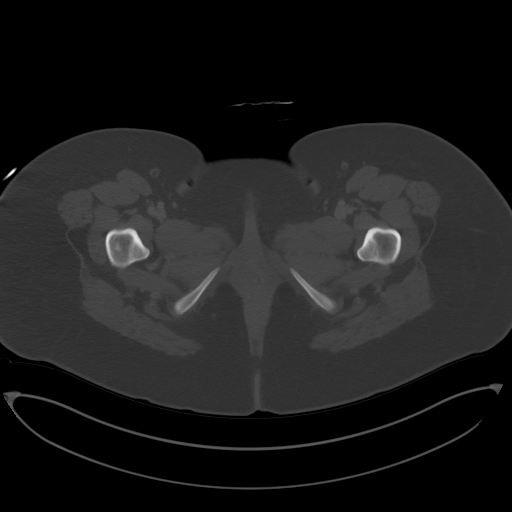
[im 12/92  soft-tissue]
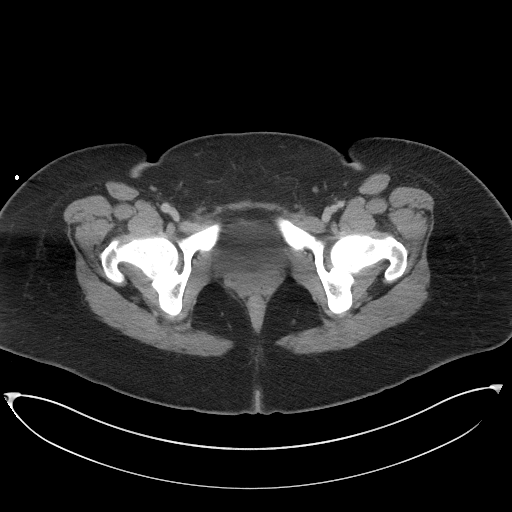
[im 20/92  soft-tissue]
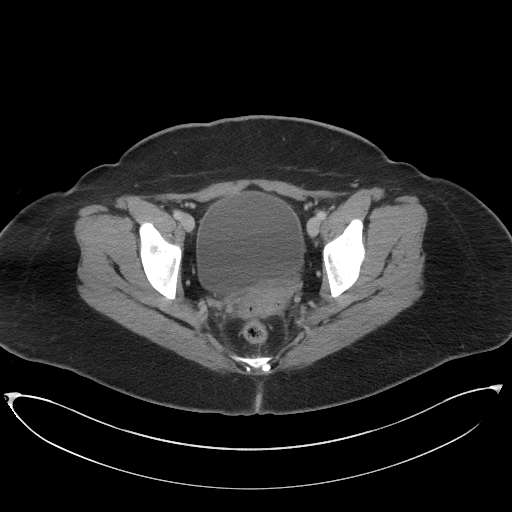
[im 24/92  soft-tissue]
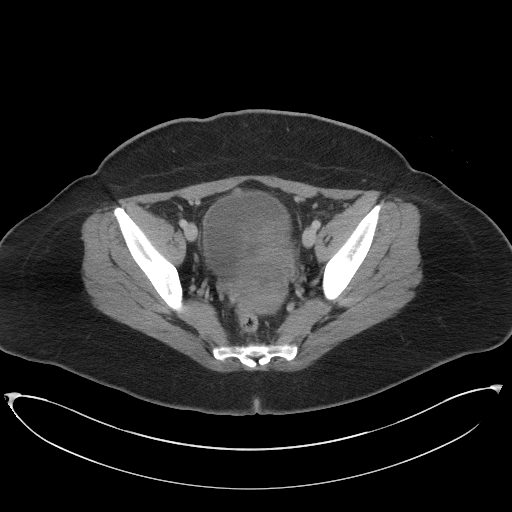
[im 32/92  soft-tissue]
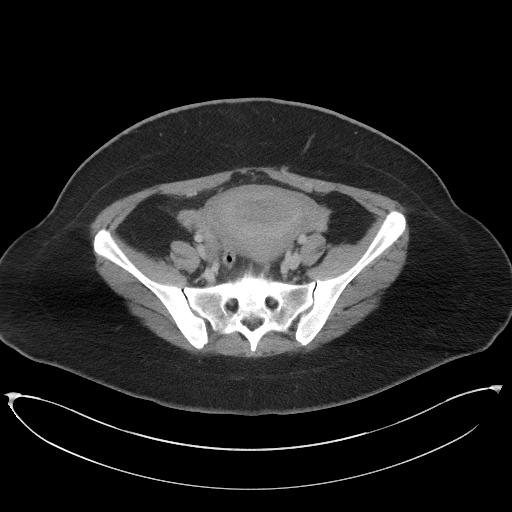
[im 40/92  soft-tissue]
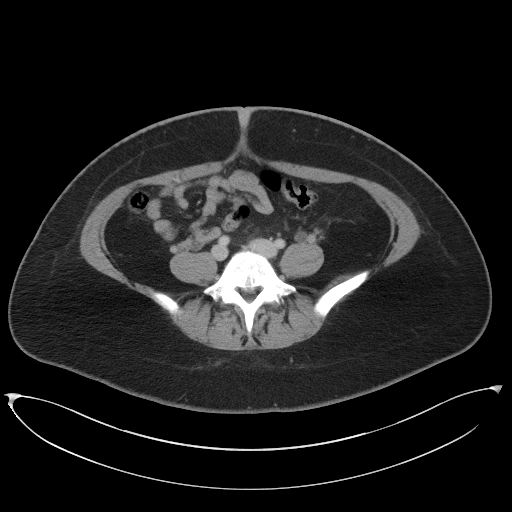
[im 48/92  soft-tissue]
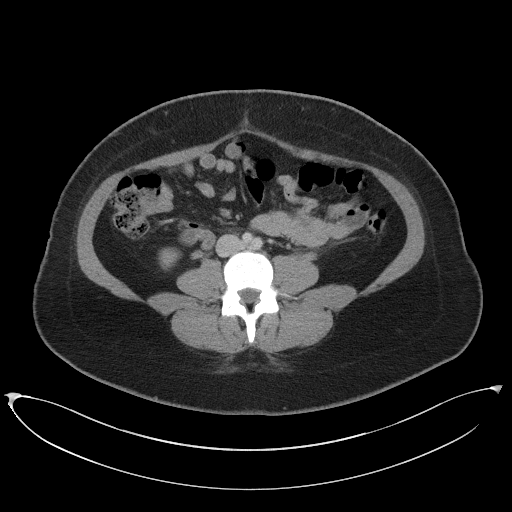
[im 52/92  soft-tissue]
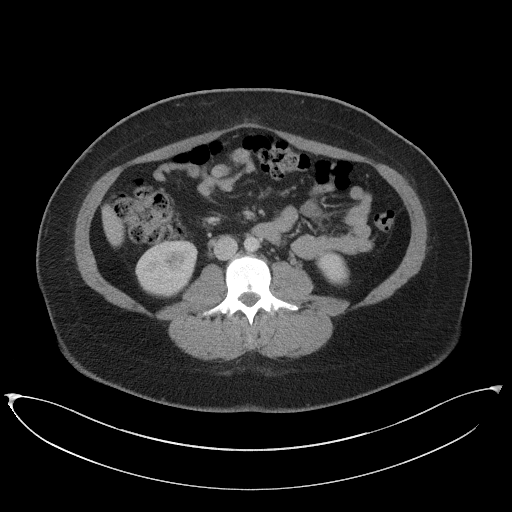
[im 60/92  soft-tissue]
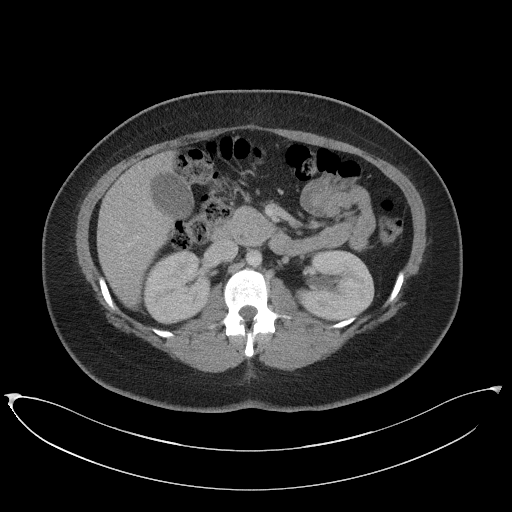
[im 60/92  bone]
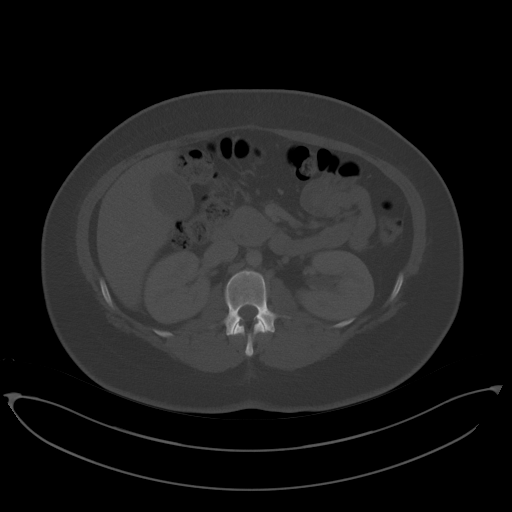
[im 68/92  soft-tissue]
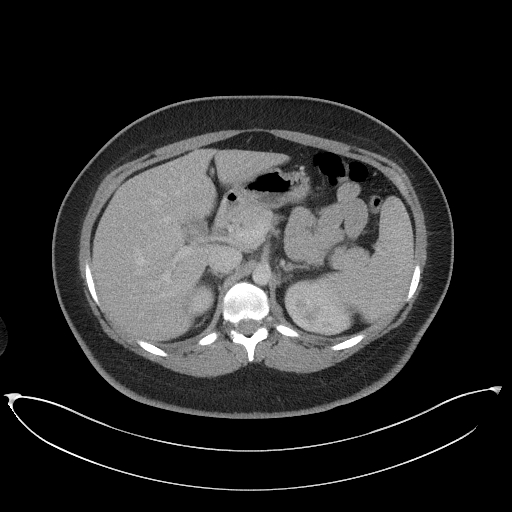
[im 72/92  soft-tissue]
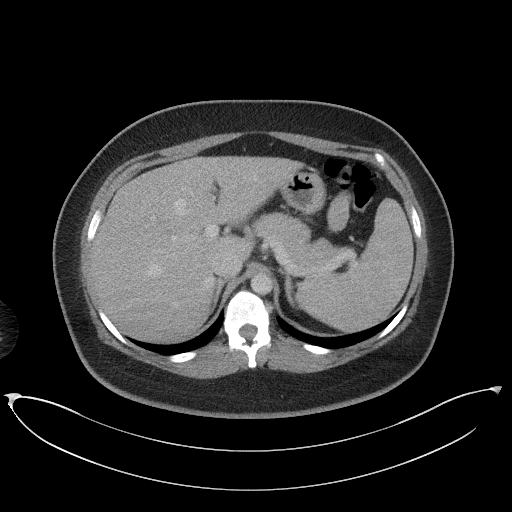
[im 80/92  soft-tissue]
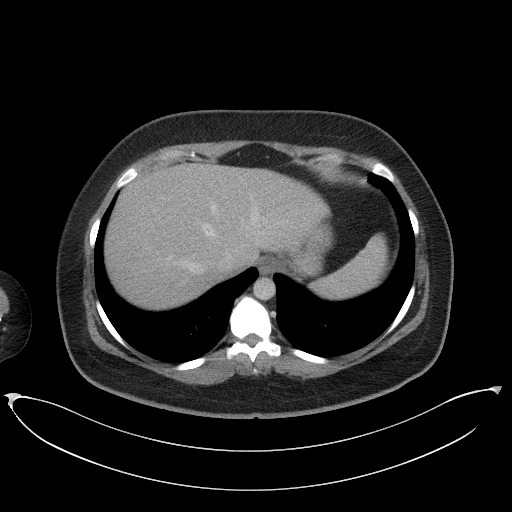
[im 88/92  soft-tissue]
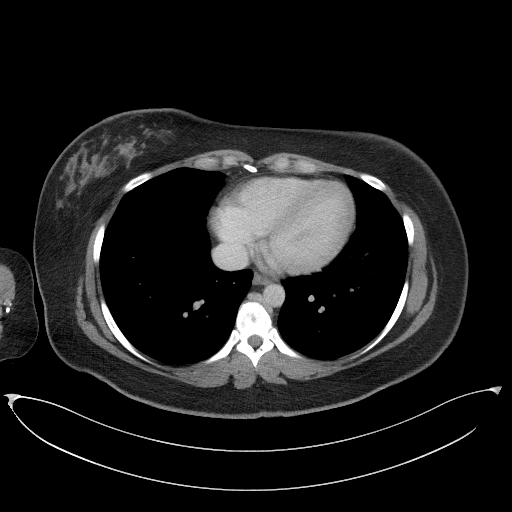

[Series 6: a/p w/ cor · coronal · 0.80mm/px · 3 of 190 slices shown]
[im 64/190  soft-tissue]
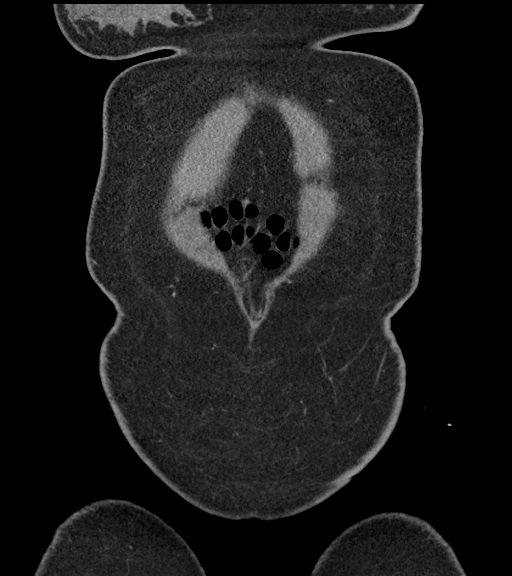
[im 85/190  soft-tissue]
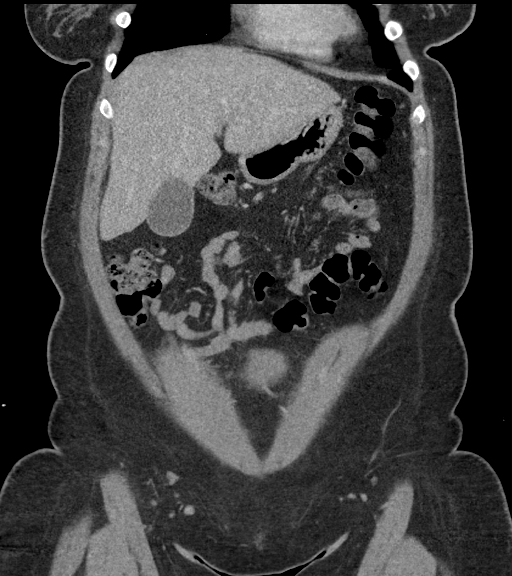
[im 106/190  soft-tissue]
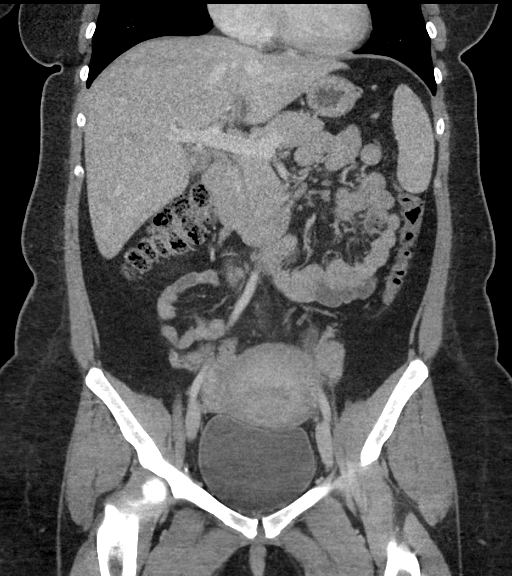

[16 of 46 positions shown; findings below may reference images not displayed]

RADIATION DOSE REDUCTION: This exam was performed according to the
departmental dose-optimization program which includes automated
exposure control, adjustment of the mA and/or kV according to
patient size and/or use of iterative reconstruction technique.

CONTRAST:  80mL OMNIPAQUE IOHEXOL 300 MG/ML  SOLN
FINDINGS: Lower chest: No acute abnormality.

Hepatobiliary: No suspicious liver abnormality. Gallbladder appears
within normal limits for CT. No signs of bile duct dilatation.

Pancreas: Unremarkable. No pancreatic ductal dilatation or
surrounding inflammatory changes.

Spleen: Normal in size without focal abnormality.

Adrenals/Urinary Tract: Normal adrenal glands. No focal kidney
abnormality identified. There is mild proximal left hydroureter up
to the level of the uterine fundus. No urinary tract calculi
identified. The bladder appears normal.

Stomach/Bowel: Stomach appears normal. The appendix is visualized
and is within normal limits. No signs of bowel wall thickening,
inflammation, or distension.

Vascular/Lymphatic: No significant vascular findings are present. No
enlarged abdominal or pelvic lymph nodes.

Reproductive: Uterus and bilateral adnexa are unremarkable.

Other: No free fluid or fluid collections.

Musculoskeletal: No acute or significant osseous findings.
IMPRESSION: 1. No acute findings identified within the abdomen or pelvis.
2. Gallstones are better seen on ultrasound performed earlier today.
3. Mild proximal left hydroureter up to the level of the uterine
fundus. No urinary tract calculi identified. Findings may reflect
residual changes of pregnancy. Alternatively, this may be seen with
upper urinary tract infection.

## 2022-11-05 ENCOUNTER — Emergency Department
Admission: EM | Admit: 2022-11-05 | Discharge: 2022-11-05 | Disposition: A | Discharge status: Home / Self Care | Payer: Medicaid Other | Attending: Emergency Medicine | Admitting: Emergency Medicine

## 2022-11-05 ENCOUNTER — Encounter: Payer: Self-pay | Admitting: Emergency Medicine

## 2022-11-05 ENCOUNTER — Other Ambulatory Visit: Payer: Self-pay

## 2022-11-05 DIAGNOSIS — T782XXA Anaphylactic shock, unspecified, initial encounter: Secondary | ICD-10-CM | POA: Insufficient documentation

## 2022-11-05 MED ORDER — FAMOTIDINE IN NACL 20-0.9 MG/50ML-% IV SOLN
20.0000 mg | Freq: Once | INTRAVENOUS | Status: AC
  Administered 2022-11-05: 20 mg via INTRAVENOUS
  Filled 2022-11-05: qty 50

## 2022-11-05 MED ORDER — EPINEPHRINE 0.3 MG/0.3ML IJ SOAJ: 0.3000 mg | INTRAMUSCULAR | 0 refills | Status: AC | PRN

## 2022-11-05 MED ORDER — METHYLPREDNISOLONE SODIUM SUCC 125 MG IJ SOLR
125.0000 mg | Freq: Once | INTRAMUSCULAR | Status: AC
  Administered 2022-11-05: 125 mg via INTRAVENOUS
  Filled 2022-11-05: qty 2

## 2022-11-05 MED ORDER — SODIUM CHLORIDE 0.9 % IV BOLUS
1000.0000 mL | Freq: Once | INTRAVENOUS | Status: AC
  Administered 2022-11-05: 1000 mL via INTRAVENOUS

## 2022-11-05 MED ORDER — PREDNISONE 50 MG PO TABS: ORAL_TABLET | ORAL | 0 refills | Status: AC

## 2022-11-05 MED ORDER — EPINEPHRINE 0.3 MG/0.3ML IJ SOAJ
0.3000 mg | Freq: Once | INTRAMUSCULAR | Status: AC
  Administered 2022-11-05: 0.3 mg via INTRAMUSCULAR
  Filled 2022-11-05: qty 0.3

## 2022-11-05 MED ORDER — CETIRIZINE HCL 10 MG PO TABS: 10.0000 mg | ORAL_TABLET | Freq: Every day | ORAL | 2 refills | Status: AC | PRN

## 2022-11-05 NOTE — ED Provider Notes (Addendum)
Care assumed of patient from outgoing provider.  See their note for initial history, exam and plan.  Anaphylactic reaction.  Symptoms resolved after epinephrine.  Plan to discharge home with EpiPen after period of observation if no recurrent symptoms.  Patient reevaluated, symptoms resolved.  Feeling much better.  Given a prescription for EpiPen and instructions of when to use.  Given return precautions for recurrence of symptoms.  Given information to establish care with a primary care provider.      Corena Herter, MD 11/05/22 1515    Corena Herter, MD 11/05/22 [phone]

## 2022-11-05 NOTE — Discharge Instructions (Addendum)
Take medications as prescribed.  Thank you for choosing [place] for your health care today!  Please see your primary doctor this week for a follow up appointment.   If you have any new, worsening, or unexpected symptoms call your doctor right away or come back to the emergency department for reevaluation.  It was my pleasure to care for you today.   Daneil Dan [place] Charon, MD

## 2022-11-05 NOTE — ED Triage Notes (Signed)
Pt to ED via POV from home. Pt reports PTA she started having an allergic rx. Pt deneis new foods, detergents or medications. Pt reports coworker was eating shrimp and she is allergic but denies ingestion.  Pt reports rash/itching on chest and arms and SOB.

## 2022-11-05 NOTE — ED Provider Notes (Signed)
[facility] Provider Note    Event Date/Time   First MD Initiated Contact with Patient 11/05/22 1259     (approximate)   History   Allergic Reaction   HPI  Alexandria Gomez is a 27 y.o. female   Past medical history of shellfish and almond allergy who presents emergency department with allergic reaction.  Exposure to shrimp eaten by her coworker approximately 30 minutes prior to arrival to the emergency department.  She broke out in an itchy rash all throughout her body as well as tightness in the throat and shortness of breath.  She gave herself Benadryl but did not have her EpiPen so came to the emergency department.  Since getting the Benadryl she has mild improvement but still feels itchy all over and a sensation of tightness in the throat.  Reports no other acute medical complaints.      Physical Exam   Triage Vital Signs: ED Triage Vitals  Encounter Vitals Group     BP 11/05/22 1257 (!) 150/111     Systolic BP Percentile --      Diastolic BP Percentile --      Pulse Rate 11/05/22 1259 94     Resp 11/05/22 1257 18     Temp 11/05/22 1257 98 F (36.7 C)     Temp Source 11/05/22 1257 Oral     SpO2 11/05/22 1257 95 %     Weight --      Height --      Head Circumference --      Peak Flow --      Pain Score 11/05/22 1257 3     Pain Loc --      Pain Education --      Exclude from Growth Chart --     Most recent vital signs: Vitals:   11/05/22 1257 11/05/22 1259  BP: (!) 150/111   Pulse:  94  Resp: 18   Temp: 98 F (36.7 C)   SpO2: 95%     General: Awake, no distress.  CV:  Good peripheral perfusion.  Resp:  Normal effort.  Abd:  No distention.  Other:  Mild rash on the exposed upper extremities, as well as the neck and torso.  No wheezing on auscultation.  Oropharynx appears normal without swelling.  She is phonating well, no respiratory distress.  Soft nontender abdomen.   ED Results / Procedures / Treatments    Labs (all labs ordered are listed, but only abnormal results are displayed) Labs Reviewed - No data to display  PROCEDURES:  Critical Care performed: Yes, see critical care procedure note(s)  .Critical Care  Performed by: Pilar Jarvis, MD Authorized by: Pilar Jarvis, MD   Critical care provider statement:    Critical care time (minutes):  30   Critical care was time spent personally by me on the following activities:  Development of treatment plan with patient or surrogate, discussions with consultants, evaluation of patient's response to treatment, examination of patient, ordering and review of laboratory studies, ordering and review of radiographic studies, ordering and performing treatments and interventions, pulse oximetry, re-evaluation of patient's condition and review of old charts    MEDICATIONS ORDERED IN ED: Medications  famotidine (PEPCID) IVPB 20 mg premix (20 mg Intravenous New Bag/Given 11/05/22 1313)  EPINEPHrine (EPI-PEN) injection 0.3 mg (0.3 mg Intramuscular Given 11/05/22 1312)  methylPREDNISolone sodium succinate (SOLU-MEDROL) 125 mg/2 mL injection 125 mg (125 mg Intravenous Given 11/05/22 1312)  sodium chloride 0.9 %  bolus 1,000 mL (1,000 mLs Intravenous New Bag/Given 11/05/22 1315)     IMPRESSION / MDM / ASSESSMENT AND PLAN / ED COURSE  I reviewed the triage vital signs and the nursing notes.                                Patient's presentation is most consistent with acute presentation with potential threat to life or bodily function.  Differential diagnosis includes, but is not limited to, anaphylaxis, allergic reaction, airway compromise   The patient is on the cardiac monitor to evaluate for evidence of arrhythmia and/or significant heart rate changes.  MDM:    Patient with anaphylactic reaction to exposure to shellfish, known allergy, with multisystem involvement including skin, mucosal membranes, tightness in the throat, will give EpiPen, steroids,  Pepcid, already received 50 mg of Benadryl prior to arrival.  Will observe for 4 hours post epinephrine administration for rebound.  If continues to feel well, will discharge with steroid burst and antihistamine.        FINAL CLINICAL IMPRESSION(S) / ED DIAGNOSES   Final diagnoses:  Anaphylaxis, initial encounter     Rx / DC Orders   ED Discharge Orders          Ordered    cetirizine (ZYRTEC ALLERGY) 10 MG tablet  Daily PRN        11/05/22 1341    predniSONE (DELTASONE) 50 MG tablet        11/05/22 1341             Note:  This document was prepared using Dragon voice recognition software and may include unintentional dictation errors.    Pilar Jarvis, MD 11/05/22 1341

## 2022-11-05 NOTE — ED Notes (Signed)
No change  States she is feeling better

## 2022-11-05 NOTE — ED Notes (Signed)
Resting at present  Denies any new complaints

## 2023-09-02 ENCOUNTER — Other Ambulatory Visit: Payer: Self-pay

## 2023-09-02 ENCOUNTER — Emergency Department (HOSPITAL_COMMUNITY)

## 2023-09-02 ENCOUNTER — Encounter (HOSPITAL_COMMUNITY): Payer: Self-pay

## 2023-09-02 ENCOUNTER — Emergency Department (HOSPITAL_COMMUNITY)
Admission: EM | Admit: 2023-09-02 | Discharge: 2023-09-02 | Disposition: A | Discharge status: Home / Self Care | Attending: Emergency Medicine | Admitting: Emergency Medicine

## 2023-09-02 DIAGNOSIS — R109 Unspecified abdominal pain: Secondary | ICD-10-CM

## 2023-09-02 DIAGNOSIS — R1011 Right upper quadrant pain: Secondary | ICD-10-CM | POA: Diagnosis present

## 2023-09-02 LAB — COMPREHENSIVE METABOLIC PANEL WITH GFR
ALT: 16 U/L (ref 0–44)
AST: 17 U/L (ref 15–41)
Albumin: 4 g/dL (ref 3.5–5.0)
Alkaline Phosphatase: 82 U/L (ref 38–126)
Anion gap: 8 (ref 5–15)
BUN: 9 mg/dL (ref 6–20)
CO2: 27 mmol/L (ref 22–32)
Calcium: 9.4 mg/dL (ref 8.9–10.3)
Chloride: 105 mmol/L (ref 98–111)
Creatinine, Ser: 0.66 mg/dL (ref 0.44–1.00)
GFR, Estimated: >60 mL/min (ref >60)
Glucose, Bld: 96 mg/dL (ref 70–99)
Potassium: 4.2 mmol/L (ref 3.5–5.1)
Sodium: 140 mmol/L (ref 135–145)
Total Bilirubin: 0.4 mg/dL (ref 0.0–1.2)
Total Protein: 7.2 g/dL (ref 6.5–8.1)

## 2023-09-02 LAB — CBC
HCT: 41 % (ref 36.0–46.0)
Hemoglobin: 13.7 g/dL (ref 12.0–15.0)
MCH: 30.2 pg (ref 26.0–34.0)
MCHC: 33.4 g/dL (ref 30.0–36.0)
MCV: 90.5 fL (ref 80.0–100.0)
Platelets: 287 K/uL (ref 150–400)
RBC: 4.53 MIL/uL (ref 3.87–5.11)
RDW: 12.2 % (ref 11.5–15.5)
WBC: 7.9 K/uL (ref 4.0–10.5)
nRBC: 0 % (ref 0.0–0.2)

## 2023-09-02 LAB — URINALYSIS, ROUTINE W REFLEX MICROSCOPIC
Bilirubin Urine: NEGATIVE
Glucose, UA: NEGATIVE mg/dL
Hgb urine dipstick: NEGATIVE
Ketones, ur: NEGATIVE mg/dL
Leukocytes,Ua: NEGATIVE
Nitrite: NEGATIVE
Protein, ur: NEGATIVE mg/dL
Specific Gravity, Urine: 1.017 (ref 1.005–1.030)
pH: 7 (ref 5.0–8.0)

## 2023-09-02 LAB — LIPASE, BLOOD: Lipase: 40 U/L (ref 11–51)

## 2023-09-02 LAB — WET PREP, GENITAL
Clue Cells Wet Prep HPF POC: NONE SEEN
Sperm: NONE SEEN
Trich, Wet Prep: NONE SEEN
WBC, Wet Prep HPF POC: >=10 — AB (ref <10)
Yeast Wet Prep HPF POC: NONE SEEN

## 2023-09-02 LAB — HCG, SERUM, QUALITATIVE: Preg, Serum: NEGATIVE

## 2023-09-02 LAB — HIV ANTIBODY (ROUTINE TESTING W REFLEX): HIV Screen 4th Generation wRfx: NONREACTIVE

## 2023-09-02 MED ORDER — IOHEXOL 350 MG/ML SOLN
75.0000 mL | Freq: Once | INTRAVENOUS | Status: AC | PRN
  Administered 2023-09-02: 75 mL via INTRAVENOUS

## 2023-09-02 NOTE — Discharge Instructions (Addendum)
 I have provided you with the contact information for [facility] as well as [facility], please call and schedule follow-up with them if your symptoms persist and to establish care with a primary care provider since you do not have one.  Return to the emergency department if your symptoms worsen. Your symptoms are likely musculoskeletal in nature, continue Ibuprofen /Tylenol  as needed.

## 2023-09-02 NOTE — ED Triage Notes (Signed)
 Pt to er, pt states that she is here for R sided abd pain, states that the pain has been there for a while states that it has been getting worse over the past few days, states that the pain is worse with movement

## 2023-09-02 NOTE — ED Provider Triage Note (Signed)
 Emergency Medicine Provider Triage Evaluation Note  Alexandria Gomez , a 28 y.o. female  was evaluated in triage.  Pt complains of right flank pain, nausea, intermittently for a few weeks, worse over the last few days. Last MP 2 weeks ago. No dysuria, hematuria. Worse with movement. Hx of cholecystectomy. No hx of kidney stones.  Review of Systems  Positive: Flank pain, nausea Negative: Dysuria, fever, chills  Physical Exam  BP 127/89 (BP Location: Right Arm)   Pulse 93   Temp 97.9 F (36.6 C)   Resp 17   Wt 86.2 kg   SpO2 100%   BMI 31.62 kg/m  Gen:   Awake, no distress   Resp:  Normal effort  MSK:   Moves extremities without difficulty  Other:  Focal ttp suprapubically, and on right flank, no rebound, rigidity, guarding  Medical Decision Making  Medically screening exam initiated at 6:05 PM.  Appropriate orders placed.  Alexandria Gomez was informed that the remainder of the evaluation will be completed by another provider, this initial triage assessment does not replace that evaluation, and the importance of remaining in the ED until their evaluation is complete.  Workup initiated in triage    Alexandria Gomez DEL, [place] 09/02/23 1805

## 2023-09-02 NOTE — ED Provider Notes (Signed)
 [place] [facility] AT [facility] Provider Note   CSN: [id number] Arrival date & time: 09/02/23  1708     Patient presents with: Abdominal Pain   Alexandria Gomez is a 28 y.o. female.   28 year old female complaining of abdominal pain.  Patient reports that she has had right upper quadrant abdominal pain for several weeks, however the past few days it has been getting worse.  She describes tenderness to palpation of her right upper abdomen, no known injury/inciting event.  She had a cholecystectomy approximately 2 years ago, otherwise denies abdominal surgeries.  She reports more frequent bowel movements lately along with occasional nausea, but denies diarrhea, fever, vomiting.  Her last menstrual period was approximately 2 weeks ago, she says that this menstrual cycle lasted longer than usual for her, was also associated with passage of blood clots, she has noticed a fishy vaginal odor but otherwise denies changes in vaginal discharge or pain with intercourse.   Abdominal Pain      Prior to Admission medications   Medication Sig Start Date End Date Taking? Authorizing Provider  acetaminophen  (TYLENOL ) 500 MG tablet Take 1,000 mg by mouth every 6 (six) hours as needed.    [provider]  cetirizine  (ZYRTEC  ALLERGY ) 10 MG tablet Take 1 tablet (10 mg total) by mouth daily as needed for allergies. 11/05/22 11/05/23  Cyrena Mylar, MD  EPINEPHrine  0.3 mg/0.3 mL IJ SOAJ injection Inject 0.3 mg into the muscle as needed for anaphylaxis. 08/11/21   Elnor Jayson LABOR, DO  EPINEPHrine  0.3 mg/0.3 mL IJ SOAJ injection Inject 0.3 mg into the muscle as needed. 11/05/22   Cyrena Mylar, MD  ibuprofen  (ADVIL ) 800 MG tablet Take 1 tablet (800 mg total) by mouth every 8 (eight) hours as needed for mild pain or moderate pain. 01/08/22   Connell Davies, MD  predniSONE  (DELTASONE ) 50 MG tablet Take 1 tablet daily for the next 5 days. 11/05/22   Cyrena Mylar, MD    Allergies: Elysa  (diagnostic) and Shrimp (diagnostic)    Review of Systems  Gastrointestinal:  Positive for abdominal pain.    Updated Vital Signs BP 116/84   Pulse 83   Temp (!) 97.1 F (36.2 C) (Oral)   Resp 17   Wt 86.2 kg   SpO2 100%   BMI 31.62 kg/m     Physical Exam Vitals and nursing note reviewed.  HENT:     Head: Normocephalic.  Cardiovascular:     Rate and Rhythm: Normal rate.  Pulmonary:     Effort: Pulmonary effort is normal.  Abdominal:     Palpations: Abdomen is soft.     Tenderness: There is abdominal tenderness in the right upper quadrant and right lower quadrant. There is no right CVA tenderness, left CVA tenderness or guarding.  Skin:    General: Skin is warm and dry.  Neurological:     Mental Status: She is alert and oriented to person, place, and time.     (all labs ordered are listed, but only abnormal results are displayed) Labs Reviewed  WET PREP, GENITAL - Abnormal; Notable for the following components:      Result Value   WBC, Wet Prep HPF POC >=10 (*)    All other components within normal limits  URINALYSIS, ROUTINE W REFLEX MICROSCOPIC - Abnormal; Notable for the following components:   APPearance CLOUDY (*)    All other components within normal limits  LIPASE, BLOOD  COMPREHENSIVE METABOLIC PANEL WITH GFR  CBC  HCG, SERUM, QUALITATIVE  HIV ANTIBODY (ROUTINE TESTING W REFLEX)  RPR  GC/CHLAMYDIA PROBE AMP ([place]) NOT AT [facility]    EKG: None  Radiology: CT ABDOMEN PELVIS W CONTRAST Result Date: 09/02/2023 CLINICAL DATA:  Right upper quadrant and right lower quadrant abdominal pain. Right flank pain. Nausea. Symptoms for up to 2 weeks. EXAM: CT ABDOMEN AND PELVIS WITH CONTRAST TECHNIQUE: Multidetector CT imaging of the abdomen and pelvis was performed using the standard protocol following bolus administration of intravenous contrast. RADIATION DOSE REDUCTION: This exam was performed according to the departmental dose-optimization program which  includes automated exposure control, adjustment of the mA and/or kV according to patient size and/or use of iterative reconstruction technique. CONTRAST:  75mL OMNIPAQUE  IOHEXOL  350 MG/ML SOLN COMPARISON:  03/15/2021 FINDINGS: Lower chest: Lung bases are clear. Hepatobiliary: No focal liver abnormality is seen. Status post cholecystectomy. No biliary dilatation. Pancreas: Unremarkable. No pancreatic ductal dilatation or surrounding inflammatory changes. Spleen: Normal in size without focal abnormality. Adrenals/Urinary Tract: Adrenal glands are unremarkable. Kidneys are normal, without renal calculi, focal lesion, or hydronephrosis. Bladder is unremarkable. Stomach/Bowel: Stomach is within normal limits. Appendix appears normal. No evidence of bowel wall thickening, distention, or inflammatory changes. Vascular/Lymphatic: No significant vascular findings are present. No enlarged abdominal or pelvic lymph nodes. Reproductive: Uterus and bilateral adnexa are unremarkable. Other: No abdominal wall hernia or abnormality. No abdominopelvic ascites. Musculoskeletal: No acute or significant osseous findings. IMPRESSION: No acute process demonstrated in the abdomen or pelvis. No evidence of bowel obstruction or inflammation. Appendix is normal. Electronically Signed   By: Elsie Gravely M.D.   On: 09/02/2023 22:12     Procedures   Medications Ordered in the ED  iohexol  (OMNIPAQUE ) 350 MG/ML injection 75 mL (75 mLs Intravenous Contrast Given 09/02/23 2208)                                    Medical Decision Making This patient presents to the ED for concern of abdominal pain, this involves an extensive number of treatment options, and is a complaint that carries with it a high risk of complications and morbidity.  The differential diagnosis includes appendicitis, pancreatitis, musculoskeletal pain/strain/sprain, PID, bowel obstruction.    Co morbidities that complicate the patient evaluation  History of  cholecystectomy    Additional history obtained:  Additional history obtained from record review External records from outside source obtained and reviewed including prior ED notes   Lab Tests:  I Ordered, and personally interpreted labs.  The pertinent results include: CBC within normal limits, CMP within normal limits, lipase within normal limits.  Urinalysis unremarkable, serum hCG negative.  Wet prep notable for white blood cells, however negative for yeast/trichomoniasis/clue cells/sperm.   Imaging Studies ordered:  I ordered imaging studies including CT abdomen/pelvis I independently visualized and interpreted imaging which showed No acute process demonstrated in the abdomen or pelvis. No evidence of bowel obstruction or inflammation. Appendix is normal.  I agree with the radiologist interpretation   Cardiac Monitoring: / EKG:  The patient was maintained on a cardiac monitor.  I personally viewed and interpreted the cardiac monitored which showed an underlying rhythm of: NSR   Problem List / ED Course / Critical interventions / Medication management I have reviewed the patients home medicines and have made adjustments as needed   Test / Admission - Considered:  Physical exam is notable as above.  Labs are  generally reassuring.  CT abdomen/pelvis results as above. I feel that the patient's pain may be musculoskeletal in nature, I encouraged her to continue Tylenol /Ibuprofen  as needed for pain. I have a low suspicion for PID, given that majority of patient's pain is the RUQ, and she denies any fever, change in vaginal discharge, or pelvic pain. I did test patient for STI's given complaint of transient vaginal odor, HIV/RPR/Gonorrhea/chlamydia pending at time of discharge, although I have a low suspicion that these will be positive. I discussed these findings in depth with patient, she voiced understanding. I have provided her with the contact information for [facility] as well as [facility] to schedule follow-up if her symptoms persist, she does not have a PCP. She is in agreement with this plan. Strict return precautions discussed.     Amount and/or Complexity of Data Reviewed Labs: ordered. Radiology: ordered.  Risk Prescription drug management.        Final diagnoses:  Abdominal pain, unspecified abdominal location    ED Discharge Orders     None          Glendia Rocky SAILOR, [place] 09/02/23 2255    Elnor Savant A, DO 09/03/23 0007

## 2023-09-03 LAB — GC/CHLAMYDIA PROBE AMP (~~LOC~~) NOT AT ARMC
Chlamydia: NEGATIVE
Comment: NEGATIVE
Comment: NORMAL
Neisseria Gonorrhea: NEGATIVE

## 2023-09-03 LAB — RPR: RPR Ser Ql: NONREACTIVE
